# Patient Record
Sex: Female | Born: 1955
Health system: Southern US, Community
[De-identification: ages and names within clinical notes are randomized; demographics above are authoritative.]

## PROBLEM LIST (undated history)

## (undated) DIAGNOSIS — E7800 Pure hypercholesterolemia, unspecified: Secondary | ICD-10-CM

## (undated) DIAGNOSIS — L97929 Non-pressure chronic ulcer of unspecified part of left lower leg with unspecified severity: Secondary | ICD-10-CM

## (undated) DIAGNOSIS — K219 Gastro-esophageal reflux disease without esophagitis: Secondary | ICD-10-CM

## (undated) DIAGNOSIS — J189 Pneumonia, unspecified organism: Secondary | ICD-10-CM

## (undated) DIAGNOSIS — M869 Osteomyelitis, unspecified: Secondary | ICD-10-CM

## (undated) DIAGNOSIS — I10 Essential (primary) hypertension: Secondary | ICD-10-CM

## (undated) DIAGNOSIS — M1990 Unspecified osteoarthritis, unspecified site: Secondary | ICD-10-CM

## (undated) DIAGNOSIS — R7303 Prediabetes: Secondary | ICD-10-CM

## (undated) DIAGNOSIS — G629 Polyneuropathy, unspecified: Secondary | ICD-10-CM

## (undated) HISTORY — PX: BLADDER SUSPENSION: SHX72

## (undated) HISTORY — PX: ABDOMINAL HYSTERECTOMY: SHX81

---

## 2001-02-24 ENCOUNTER — Other Ambulatory Visit: Admission: RE | Admit: 2001-02-24 | Discharge: 2001-02-24 | Payer: Self-pay | Admitting: Obstetrics and Gynecology

## 2002-05-19 ENCOUNTER — Encounter: Payer: Self-pay | Admitting: Family Medicine

## 2002-05-19 ENCOUNTER — Ambulatory Visit (HOSPITAL_COMMUNITY): Admission: RE | Admit: 2002-05-19 | Discharge: 2002-05-19 | Payer: Self-pay | Admitting: Family Medicine

## 2002-06-01 ENCOUNTER — Encounter: Payer: Self-pay | Admitting: Neurology

## 2002-06-01 ENCOUNTER — Ambulatory Visit (HOSPITAL_COMMUNITY): Admission: RE | Admit: 2002-06-01 | Discharge: 2002-06-01 | Payer: Self-pay | Admitting: Neurology

## 2005-12-19 ENCOUNTER — Ambulatory Visit (HOSPITAL_COMMUNITY): Admission: RE | Admit: 2005-12-19 | Discharge: 2005-12-19 | Payer: Self-pay | Admitting: Obstetrics and Gynecology

## 2006-05-27 ENCOUNTER — Inpatient Hospital Stay (HOSPITAL_COMMUNITY): Admission: RE | Admit: 2006-05-27 | Discharge: 2006-05-29 | Payer: Self-pay | Admitting: Obstetrics and Gynecology

## 2006-05-27 ENCOUNTER — Encounter (INDEPENDENT_AMBULATORY_CARE_PROVIDER_SITE_OTHER): Payer: Self-pay | Admitting: *Deleted

## 2009-09-07 ENCOUNTER — Encounter: Payer: Self-pay | Admitting: Internal Medicine

## 2009-09-26 ENCOUNTER — Ambulatory Visit: Payer: Self-pay | Admitting: Internal Medicine

## 2009-09-26 ENCOUNTER — Ambulatory Visit (HOSPITAL_COMMUNITY): Admission: RE | Admit: 2009-09-26 | Discharge: 2009-09-26 | Payer: Self-pay | Admitting: Internal Medicine

## 2009-09-28 ENCOUNTER — Encounter: Payer: Self-pay | Admitting: Internal Medicine

## 2009-09-29 ENCOUNTER — Ambulatory Visit (HOSPITAL_COMMUNITY): Admission: RE | Admit: 2009-09-29 | Discharge: 2009-09-29 | Payer: Self-pay | Admitting: Obstetrics and Gynecology

## 2009-10-05 ENCOUNTER — Telehealth (INDEPENDENT_AMBULATORY_CARE_PROVIDER_SITE_OTHER): Payer: Self-pay

## 2009-10-10 ENCOUNTER — Encounter: Payer: Self-pay | Admitting: Internal Medicine

## 2010-05-23 NOTE — Letter (Signed)
Summary: Patient Notice, Colon Biopsy Results  [facility]  [address]   [place], [place] [address]   Phone: [phone]  Fax: [phone]       September 28, 2009   MEGHNA HAGMANN [address] [place], [place]  [address] Jun 11, 1955    Dear Ms. Schweiger,  I am pleased to inform you that the biopsies taken during your recent colonoscopy did not show any evidence of cancer upon pathologic examination.  Additional information/recommendations:  You should have a repeat colonoscopy examination  in 3 years.  Please call us if you are having persistent problems or have questions about your condition that have not been fully answered at this time.  Sincerely,    R. Roetta Sessions MD, FACP [facility] Ph: [phone]    Fax: [phone]   Appended Document: Patient Notice, Colon Biopsy Results letter mailed to pt  Appended Document: Patient Notice, Colon Biopsy Results reminder in computer

## 2010-05-23 NOTE — Progress Notes (Signed)
Summary: NON-URGENT PHONE NOTE  Phone Note Call from Patient   Caller: Patient Summary of Call: NON-URGENT CALL/ PT AWARE DR Jena Gauss IS ON VACATION.....  JUST WHEN HE RETURNS AT HIS CONVENIENCE... Pt called and said she received letter to repeat TCS in 3 years. She just wanted to know why 3, she felt it would probably be in 5 years. She also wanted to know if he would recommend Metamucil, she sees that advertised for colon. Please advise. Initial call taken by: Cloria Spring LPN,  October 05, 2009 10:37 AM     Appended Document: NON-URGENT PHONE NOTE multiple polyps and one greater than 1 cm in diameter.  Metamucil would be one way to increase fiber in diet  Appended Document: NON-URGENT PHONE NOTE tried to call pt- number has been disconnected

## 2010-05-23 NOTE — Letter (Signed)
Summary: TCS order  TCS order   Imported By: Minna Merritts 09/07/2009 14:19:07  _____________________________________________________________________  External Attachment:    Type:   Image     Comment:   External Document

## 2010-09-08 NOTE — H&P (Signed)
[name], [name]                ACCOUNT NO.:  [id number]   MEDICAL RECORD NO.:  [id number]          PATIENT TYPE:  INP   LOCATION:  NA                            FACILITY:  [facility]   PHYSICIAN:  Tilda Burrow, M.D. DATE OF BIRTH:  Mar 26, 1956   DATE OF ADMISSION:  05/27/2006  DATE OF DISCHARGE:  LH                              HISTORY & PHYSICAL   ADMISSION DIAGNOSES:  1. Cystocele.  2. Second-degree uterine descensus.  3. No evidence of urinary incontinence.  4. Lax posterior perineal body.   HISTORY OF PRESENT ILLNESS:  This 55 year old female, postmenopausal x2  years, is admitted for vaginal hysterectomy, anterior and posterior  repair.  She has cystocele, second-degree uterine descensus.  Plans are  for removal of the uterus, tubes and ovaries if accessible, anterior  repair beneath the bulging cystocele which is present to the introitus.  The posterior perineum will be tightened as well to lend support to the  anterior wall.  She does not have symptomatic rectocele complaints,  difficulty with defecation.  She specifically denies stress  incontinence.  She has been followed in our office, has had normal Pap  smears.  Two years ago, she had some postmenopausal bleeding and had  endometrial stripe confirming absence of endometrial tissues.  Pros and  cons of surgery have been reviewed including Crane's hysterectomy  booklet medical explainer, view of cystocele and uterine prolapse.  She  understands the procedure, risks, and benefits.  Specifically reviewed  plans to proceed with surgery at this time.  The tubes and ovaries will  be removed if accessible.   PAST MEDICAL HISTORY:  1. Chronic hypertension managed by [facility], Dr.      Phillips Odor.  2. Obesity.  3. Chronic neuropathy evaluated by Dr. Gerilyn Pilgrim in the past.   PAST SURGICAL HISTORY:  Vaginal delivery x2.  No other surgeries  reported.   MEDICATIONS:  Hydrochlorothiazide, gabapentin, folic  acid, B12,  potassium 20 mEq daily, and Centrum Silver multivitamins.  She has had a  bowel prep for the last 2 days.   ALLERGIES:  No known drug allergies.   SOCIAL HISTORY:  She smoked for 20 years at 1-1/2 packs per day.  She  denies alcohol and recreational drugs.   PHYSICAL EXAMINATION:  VITAL SIGNS: Height 5 feet 6 inches, weight 192.  Blood pressure 130/84.  HEENT:  Pupils equal, round, and reactive.  NECK: Supple.  CHEST:  Clear to auscultation.  ABDOMEN:  Moderate obesity.  PELVIC:  External genitalia lax posterior perineal body, second-degree  uterine descensus, cystocele present __pap________class I.  EXTREMITIES:  Without cyanosis, clubbing, or edema.   PLAN:  Total vaginal hysterectomy anterior and posterior repair with  removal of tubes and ovaries May 27, 2006.      Tilda Burrow, M.D.  Electronically Signed     JVF/MEDQ  D:  05/26/2006  T:  05/26/2006  Job:  [id number]   cc:   [facility]  Fax: [phone]

## 2010-09-08 NOTE — Op Note (Signed)
NAMEBARBARA, AHART                ACCOUNT NO.:  [id number]   MEDICAL RECORD NO.:  [id number]          PATIENT TYPE:  INP   LOCATION:  A428                          FACILITY:  [facility]   PHYSICIAN:  Tilda Burrow, M.D. DATE OF BIRTH:  [birth year]   DATE OF PROCEDURE:  05/27/2006  DATE OF DISCHARGE:                               OPERATIVE REPORT   PREOPERATIVE DIAGNOSIS:  Cystocele, second degree uterine descensus, lax  perineal body.  Multiple skin nevi, neck and face.   POSTOPERATIVE DIAGNOSIS:  Cystocele, second degree uterine descensus,  relaxed perineal body secondary to scarring from old obstetric  laceration.   OPERATION PERFORMED:  Vaginal hysterectomy, anterior and posterior  repair, bilateral salpingo-oophorectomy, excision of multiple skin nevi,  neck and face (30).   SURGEON:  Tilda Burrow, M.D.   ASSISTANTMarlinda Mike, RN.  Blackwell, CST.   ANESTHESIA:  General.   COMPLICATIONS:  None.   FINDINGS:  Extensive scarring in the old episiotomy area suggesting  secondary intention healing of the old episiotomy from her son's birth,  many years ago.   DESCRIPTION OF PROCEDURE:  The patient was taken to the operating room,  prepped and draped for combined abdominal and vaginal procedure.  Prior  to this, we addressed the issue of the multiple skin tags on the base of  the neck and face.  These were elevated with Addison scissors, cross-  clamped with a hemostat and trimmed using iris scissors, with multiple  small skin tags removed from the base of the neck, from the left mid  neck and clavicle area.  There was one fairly significant one on the  middle of her left cheek which was excised at its base after cross  clamping as well.  All these responded with adequate hemostasis and  topical neosporin was applied.  Hysterectomy was then addressed by  prepping and draping the perineum, placing a three inch weighted  speculum into the vagina, posterior colpotomy incision as  well was  making anterior colpotomy incision.  The cervix was circumscribed.  The  uterosacral ligaments were clamped, cut and suture ligated bilaterally,  using curved Zeppelin clamps, Mayo scissor transection and 0 chromic  suture ligature which was tagged.  The lower cardinal ligaments were  clamped, cut and suture ligated bilaterally.  The upper cardinal  ligaments were similarly clamped, cut and ligated using 0 chromic.  We  then marched up the broad ligament, clamping, cutting and suture  ligating it until reaching the level of the round ligaments.  The round  ligament, utero-ovarian ligament and fallopian tube were cross-clamped  in one single pedicle.  The uterus was removed.  The hemostasis was  satisfactory.  At this point we isolated the tube and ovary on the left  side, placing a Babcock clamp, retracting it inferiorly, cross-clamping  the infundibulopelvic ligament with a curved Zeppelin clamp, ligating it  and excising the specimen.  Hemostasis was checked and was satisfactory.  The opposite side was treated similarly with a double ligature on this  pedicle.   We then proceeded with inspection of the pedicles.  We found adequate  hemostasis.  0 Prolene suture was used.  2-0 Prolene suture was then  used to attach a portion of a pouch of Douglas, the cul-de-sac, to the  uterosacral ligament on each side, thus in effect, reducing the  potential for vault prolapse later.  The anterior peritoneum could be  pulled posteriorly and attached above the cuff.  The uterosacral  ligaments were pulled into the midline and like tied together.   The anterior portion of the cuff was then addressed.  Anterior repair  then was performed by grasping the anterior vaginal mucosa with two  Allis clamps, a third Allis clamp placed at the junction of the urethra  and bladder as judged by the bulge of the cystocele.  The mucosa was  trimmed off the underlying large cystocele and pulled laterally.   A  series of horizontal mattress sutures x 3 were placed to reduce the  cystocele bulge and then the redundant vaginal mucosa trimmed.  The  anterior skin edges were closed from along the entirety including the  vaginal cuff.   At this point we proceeded with posterior repair.  Palpation showed that  there was a large defect just inside the introitus and the introitus was  extremely lax.  The digital rectal exam showed adequate rectal support  but there was fibrosis all the way down to just above the rectal mucosa.  The posterior fourchette was opened transversely and an ellipse of skin  removed and then the posterior vaginal mucosa elevated over the bottom  one third of the vaginal length.  This included coring out the old  fibrotic scarred area where the previous episiotomy had healed poorly.  This has allowed [place] to identify good strong supportive tissue on each  side and a new series of vertical mattress sutures were placed using 0  Dexon pulling the perineal body together.  The first layer had three  interrupted sutures which resulted in dramatic improvement in the  perineal support, then the second and third layers with continuous  running 0 Dexon with good rebuilding of the perineum.  The vaginal  mucosa was trimmed very slightly and then continuous running 2-0 chromic  used to close the vaginal mucosa.  The perineal body was dramatically  improved in its firmness and support of the anterior vagina.  Sponge and  needle counts were correct.  Vaginal pack was inserted as well as Foley  catheter with the patient tolerating procedure well, going to recovery  room in good condition.      Tilda Burrow, M.D.  Electronically Signed     JVF/MEDQ  D:  05/27/2006  T:  05/27/2006  Job:  [id number]

## 2010-09-08 NOTE — Discharge Summary (Signed)
NAMEDULA, HAVLIK                ACCOUNT NO.:  [id number]   MEDICAL RECORD NO.:  [id number]          PATIENT TYPE:  INP   LOCATION:  A428                          FACILITY:  [facility]   PHYSICIAN:  Tilda Burrow, M.D. DATE OF BIRTH:  1955-07-04   DATE OF ADMISSION:  05/27/2006  DATE OF DISCHARGE:  02/06/2008LH                               DISCHARGE SUMMARY   ADMISSION DIAGNOSES:  1. Cystocele.  2. Secondary uterine descensus.  3. Relaxed perineal body.  4. Multiple skin nevi neck and face.   DISCHARGE DIAGNOSES:  1. Cystocele.  2. Secondary uterine descensus.  3. Relaxed perineal body.  4. Multiple skin nevi neck and face.  5. Postoperative urinary retention.   PROCEDURES:  1. Vaginal hysterectomy with bilateral salpingo-oophorectomy.  2. Anterior and posterior repair.  3. Excision of multiple skin nevi neck and face.   HOSPITAL SUMMARY:  A 55 year old female, postmenopausal  for years,  admitted for TVH, A&P repair with BSO planned.  She also had multiple  skin nevi removed off the neck.   PAST MEDICAL HISTORY:  1. Chronic hypertension.  2. Obesity.  3. Chronic peripheral neuropathy.   HOSPITAL COURSE:  The patient was admitted.  Laboratory data on  admission includes hemoglobin 14, hematocrit 43.7.  BUN 3, creatinine  0.62.  Albumin 3.5.  Liver function tests normal.  Blood type is A  positive.  She underwent a hysterectomy with BSO, A&P repair with 300 mL  blood loss.   Postoperatively, the patient had straight forward course and vaginal  packing overnight.  Had no major vaginal bleeding.  Hemoglobin 12.7,  hematocrit 37.2 postoperative day #1.  She had hypoactive bowel sounds  that day.  She had catheter removed and had urinary retention, was  unable to void and had it reinserted.  She was kept overnight, one more  night, had the catheter removed once again and was able to void,  reinsertion of the catheter revealed 600 mL of clear urine.  We left  Foley bag in.   Will discharge her home on the following medications.   PREVIOUS MEDICINES:  1. Potassium chloride 20 mEq daily.  2. Hydrochlorothiazide 25 mg p.o. daily.  3. Gabapentin 300 mg at bedtime.  4. Folic acid daily.  5. B12 daily.  6. Centrum multivitamins daily.   ADDITIONAL MEDICATIONS:  1. Tylox one q.4h. p.r.n. pain.  2. Laxative of choice daily.  3. Levaquin 500 mg p.o. daily x7 days.   FOLLOW UP:  Five days in our office with Foley catheter to be removed  that morning and with recheck in our office on Monday.      Tilda Burrow, M.D.  Electronically Signed     JVF/MEDQ  D:  05/29/2006  T:  05/29/2006  Job:  [id number]   cc:   [facility]

## 2011-12-10 ENCOUNTER — Other Ambulatory Visit (HOSPITAL_COMMUNITY): Payer: Self-pay | Admitting: Family Medicine

## 2011-12-10 ENCOUNTER — Ambulatory Visit (HOSPITAL_COMMUNITY)
Admission: RE | Admit: 2011-12-10 | Discharge: 2011-12-10 | Disposition: A | Discharge status: Home / Self Care | Payer: BC Managed Care – PPO | Source: Ambulatory Visit | Attending: Family Medicine | Admitting: Family Medicine

## 2011-12-10 DIAGNOSIS — M25579 Pain in unspecified ankle and joints of unspecified foot: Secondary | ICD-10-CM | POA: Insufficient documentation

## 2011-12-10 DIAGNOSIS — M25473 Effusion, unspecified ankle: Secondary | ICD-10-CM | POA: Insufficient documentation

## 2011-12-10 DIAGNOSIS — S93409A Sprain of unspecified ligament of unspecified ankle, initial encounter: Secondary | ICD-10-CM

## 2011-12-10 DIAGNOSIS — S82899A Other fracture of unspecified lower leg, initial encounter for closed fracture: Secondary | ICD-10-CM | POA: Insufficient documentation

## 2011-12-10 DIAGNOSIS — X58XXXA Exposure to other specified factors, initial encounter: Secondary | ICD-10-CM | POA: Insufficient documentation

## 2011-12-10 DIAGNOSIS — M25476 Effusion, unspecified foot: Secondary | ICD-10-CM | POA: Insufficient documentation

## 2012-02-20 ENCOUNTER — Other Ambulatory Visit: Payer: Self-pay | Admitting: Obstetrics and Gynecology

## 2012-02-20 DIAGNOSIS — Z139 Encounter for screening, unspecified: Secondary | ICD-10-CM

## 2012-03-04 ENCOUNTER — Ambulatory Visit (HOSPITAL_COMMUNITY)
Admission: RE | Admit: 2012-03-04 | Discharge: 2012-03-04 | Disposition: A | Discharge status: Home / Self Care | Payer: BC Managed Care – PPO | Source: Ambulatory Visit | Attending: Obstetrics and Gynecology | Admitting: Obstetrics and Gynecology

## 2012-03-04 DIAGNOSIS — Z1231 Encounter for screening mammogram for malignant neoplasm of breast: Secondary | ICD-10-CM | POA: Insufficient documentation

## 2012-03-04 DIAGNOSIS — Z139 Encounter for screening, unspecified: Secondary | ICD-10-CM

## 2012-07-30 ENCOUNTER — Other Ambulatory Visit (HOSPITAL_COMMUNITY): Payer: Self-pay | Admitting: Orthopaedic Surgery

## 2012-07-30 DIAGNOSIS — M25562 Pain in left knee: Secondary | ICD-10-CM

## 2012-07-31 ENCOUNTER — Ambulatory Visit (HOSPITAL_COMMUNITY)
Admission: RE | Admit: 2012-07-31 | Discharge: 2012-07-31 | Disposition: A | Discharge status: Home / Self Care | Payer: BC Managed Care – PPO | Source: Ambulatory Visit | Attending: Orthopaedic Surgery | Admitting: Orthopaedic Surgery

## 2012-07-31 DIAGNOSIS — M25562 Pain in left knee: Secondary | ICD-10-CM

## 2012-07-31 DIAGNOSIS — R937 Abnormal findings on diagnostic imaging of other parts of musculoskeletal system: Secondary | ICD-10-CM | POA: Insufficient documentation

## 2012-07-31 DIAGNOSIS — M7120 Synovial cyst of popliteal space [Baker], unspecified knee: Secondary | ICD-10-CM | POA: Insufficient documentation

## 2012-07-31 DIAGNOSIS — W19XXXA Unspecified fall, initial encounter: Secondary | ICD-10-CM | POA: Insufficient documentation

## 2012-07-31 DIAGNOSIS — IMO0002 Reserved for concepts with insufficient information to code with codable children: Secondary | ICD-10-CM | POA: Insufficient documentation

## 2012-07-31 DIAGNOSIS — M25569 Pain in unspecified knee: Secondary | ICD-10-CM | POA: Insufficient documentation

## 2012-08-04 ENCOUNTER — Other Ambulatory Visit (HOSPITAL_COMMUNITY): Payer: BC Managed Care – PPO

## 2012-10-07 ENCOUNTER — Encounter: Payer: Self-pay | Admitting: Internal Medicine

## 2014-02-04 ENCOUNTER — Ambulatory Visit (INDEPENDENT_AMBULATORY_CARE_PROVIDER_SITE_OTHER): Payer: BC Managed Care – PPO | Admitting: Otolaryngology

## 2014-02-04 DIAGNOSIS — H9209 Otalgia, unspecified ear: Secondary | ICD-10-CM

## 2014-02-04 DIAGNOSIS — T162XXA Foreign body in left ear, initial encounter: Secondary | ICD-10-CM

## 2014-02-04 DIAGNOSIS — H903 Sensorineural hearing loss, bilateral: Secondary | ICD-10-CM

## 2014-02-16 ENCOUNTER — Ambulatory Visit (HOSPITAL_COMMUNITY)
Admission: RE | Admit: 2014-02-16 | Discharge: 2014-02-16 | Disposition: A | Discharge status: Home / Self Care | Payer: BC Managed Care – PPO | Source: Ambulatory Visit | Attending: Family Medicine | Admitting: Family Medicine

## 2014-02-16 ENCOUNTER — Other Ambulatory Visit (HOSPITAL_COMMUNITY): Payer: Self-pay | Admitting: Family Medicine

## 2014-02-16 DIAGNOSIS — M79644 Pain in right finger(s): Secondary | ICD-10-CM | POA: Diagnosis present

## 2014-02-16 DIAGNOSIS — M79641 Pain in right hand: Secondary | ICD-10-CM

## 2014-02-16 DIAGNOSIS — M19041 Primary osteoarthritis, right hand: Secondary | ICD-10-CM | POA: Insufficient documentation

## 2015-01-10 ENCOUNTER — Other Ambulatory Visit (HOSPITAL_COMMUNITY): Payer: Self-pay | Admitting: Family Medicine

## 2015-01-10 DIAGNOSIS — Z0000 Encounter for general adult medical examination without abnormal findings: Secondary | ICD-10-CM

## 2015-01-13 ENCOUNTER — Ambulatory Visit (HOSPITAL_COMMUNITY)
Admission: RE | Admit: 2015-01-13 | Discharge: 2015-01-13 | Disposition: A | Discharge status: Home / Self Care | Payer: BC Managed Care – PPO | Source: Ambulatory Visit | Attending: Family Medicine | Admitting: Family Medicine

## 2015-01-13 DIAGNOSIS — Z1231 Encounter for screening mammogram for malignant neoplasm of breast: Secondary | ICD-10-CM | POA: Diagnosis not present

## 2015-01-13 DIAGNOSIS — Z0000 Encounter for general adult medical examination without abnormal findings: Secondary | ICD-10-CM

## 2015-11-22 ENCOUNTER — Ambulatory Visit (HOSPITAL_COMMUNITY)
Admission: RE | Admit: 2015-11-22 | Discharge: 2015-11-22 | Disposition: A | Discharge status: Home / Self Care | Payer: BC Managed Care – PPO | Source: Ambulatory Visit | Attending: Family Medicine | Admitting: Family Medicine

## 2015-11-22 ENCOUNTER — Other Ambulatory Visit (HOSPITAL_COMMUNITY): Payer: Self-pay | Admitting: Family Medicine

## 2015-11-22 DIAGNOSIS — S93402A Sprain of unspecified ligament of left ankle, initial encounter: Secondary | ICD-10-CM | POA: Diagnosis present

## 2015-11-22 DIAGNOSIS — X58XXXD Exposure to other specified factors, subsequent encounter: Secondary | ICD-10-CM | POA: Diagnosis not present

## 2015-11-22 DIAGNOSIS — M19072 Primary osteoarthritis, left ankle and foot: Secondary | ICD-10-CM | POA: Insufficient documentation

## 2016-07-26 ENCOUNTER — Other Ambulatory Visit (HOSPITAL_COMMUNITY): Payer: Self-pay | Admitting: Family Medicine

## 2016-07-26 DIAGNOSIS — Z1231 Encounter for screening mammogram for malignant neoplasm of breast: Secondary | ICD-10-CM

## 2016-07-30 ENCOUNTER — Ambulatory Visit (HOSPITAL_COMMUNITY)
Admission: RE | Admit: 2016-07-30 | Discharge: 2016-07-30 | Disposition: A | Discharge status: Home / Self Care | Payer: BC Managed Care – PPO | Source: Ambulatory Visit | Attending: Family Medicine | Admitting: Family Medicine

## 2016-07-30 DIAGNOSIS — Z1231 Encounter for screening mammogram for malignant neoplasm of breast: Secondary | ICD-10-CM | POA: Insufficient documentation

## 2017-10-17 ENCOUNTER — Ambulatory Visit (HOSPITAL_COMMUNITY)
Admission: RE | Admit: 2017-10-17 | Discharge: 2017-10-17 | Disposition: A | Discharge status: Home / Self Care | Payer: BC Managed Care – PPO | Source: Ambulatory Visit | Attending: Family Medicine | Admitting: Family Medicine

## 2017-10-17 ENCOUNTER — Other Ambulatory Visit (HOSPITAL_COMMUNITY): Payer: Self-pay | Admitting: Family Medicine

## 2017-10-17 DIAGNOSIS — R918 Other nonspecific abnormal finding of lung field: Secondary | ICD-10-CM | POA: Insufficient documentation

## 2017-10-17 DIAGNOSIS — I517 Cardiomegaly: Secondary | ICD-10-CM | POA: Diagnosis not present

## 2017-10-17 DIAGNOSIS — J811 Chronic pulmonary edema: Secondary | ICD-10-CM | POA: Insufficient documentation

## 2018-02-14 ENCOUNTER — Encounter: Payer: Self-pay | Admitting: *Deleted

## 2018-02-14 ENCOUNTER — Telehealth: Payer: Self-pay | Admitting: *Deleted

## 2018-02-14 ENCOUNTER — Ambulatory Visit: Payer: BC Managed Care – PPO | Admitting: Internal Medicine

## 2018-02-14 ENCOUNTER — Other Ambulatory Visit: Payer: Self-pay | Admitting: *Deleted

## 2018-02-14 ENCOUNTER — Encounter: Payer: Self-pay | Admitting: Internal Medicine

## 2018-02-14 VITALS — BP 177/85 | HR 73 | Temp 97.1°F | Ht 65.5 in | Wt 199.6 lb

## 2018-02-14 DIAGNOSIS — R194 Change in bowel habit: Secondary | ICD-10-CM | POA: Diagnosis not present

## 2018-02-14 DIAGNOSIS — Z86010 Personal history of colonic polyps: Secondary | ICD-10-CM

## 2018-02-14 MED ORDER — PEG 3350-KCL-NA BICARB-NACL 420 G PO SOLR: 4000.0000 mL | Freq: Once | ORAL | 0 refills | Status: AC

## 2018-02-14 NOTE — Progress Notes (Signed)
Primary Care Physician:  Assunta Found, MD Primary Gastroenterologist:  Dr. Jena Gauss  Pre-Procedure History & Physical: HPI:  [name] is a 62 y.o. female here for follow-up.  Notes insidious difficulties eating bacon and eggs in the morning.  Has abdominal discomfort and loose nonbloody stools after consuming these foods.  Does well the rest of the day.  Does well with most everything else although can occasionally have symptoms with other foods.  Has  1-2 bowel movements daily in general  -  no bleeding.  Denies weight loss.  No upper GI tract symptoms such as odynophagia, dysphagia, early satiety nausea or vomiting.  Denies weight loss.  Reportedly borderline diabetic.  Has a history of a lower extremity neuropathy for which takes Neurontin.  She is on B12 and folate supplementation for presumed deficiency. No history consistent with artificial sweetener intake or lactose intolerance.  GI history most significant in that she had multiple colonic adenomas removed at colonoscopy 2011 and failed to return for surveillance exam 2014.  No family history of inflammatory bowel disease or colon cancer.  Denies GI surgeries otherwise.  Gallbladder remains in situ.  GYN history significant for BSO TAH with AP repair.  Has been on stool softeners since that surgery in 2008.  Denies constipation.  Denies straining.   Prior to Admission medications   Medication Sig Start Date End Date Taking? Authorizing Provider  atorvastatin (LIPITOR) 10 MG tablet Take 1.5 tablets by mouth daily. 01/31/18  Yes [provider]  Calcium Carb-Cholecalciferol (CALCIUM + D3 PO) Take by mouth daily.   Yes [provider]  Calcium Citrate-Vitamin D (CITRACAL PETITES/VITAMIN D PO) Take by mouth daily.   Yes [provider]  cetirizine (ZYRTEC) 10 MG tablet Take 10 mg by mouth as needed for allergies.   Yes [provider]  docusate sodium (COLACE) 100 MG capsule Take 200 mg by mouth  daily.   Yes [provider]  folic acid (FOLVITE) 800 MCG tablet Take 400 mcg by mouth daily.   Yes [provider]  gabapentin (NEURONTIN) 300 MG capsule Take 300 mg by mouth 2 (two) times daily. 01/17/18  Yes [provider]  hydrochlorothiazide (HYDRODIURIL) 25 MG tablet Take 25 mg by mouth daily. 01/17/18  Yes [provider]  Multiple Vitamin (MULTIVITAMIN) tablet Take 1 tablet by mouth daily.   Yes [provider]  Omega-3 Fatty Acids (FISH OIL) 1200 MG CAPS Take by mouth daily.   Yes [provider]  potassium chloride SA (K-DUR,KLOR-CON) 20 MEQ tablet Take 20 mEq by mouth daily. 02/06/18  Yes [provider]  vitamin B-12 (CYANOCOBALAMIN) 1000 MCG tablet Take 1,000 mcg by mouth daily.   Yes [provider]    Allergies as of 02/14/2018  . (No Known Allergies)    No family history on file.  Social History   Socioeconomic History  . Marital status: Widowed    Spouse name: Not on file  . Number of children: Not on file  . Years of education: Not on file  . Highest education level: Not on file  Occupational History  . Not on file  Social Needs  . Financial resource strain: Not on file  . Food insecurity:    Worry: Not on file    Inability: Not on file  . Transportation needs:    Medical: Not on file    Non-medical: Not on file  Tobacco Use  . Smoking status: Former Games developer  . Smokeless  tobacco: Never Used  Substance and Sexual Activity  . Alcohol use: Not Currently    Frequency: Never  . Drug use: Never  . Sexual activity: Not on file  Lifestyle  . Physical activity:    Days per week: Not on file    Minutes per session: Not on file  . Stress: Not on file  Relationships  . Social connections:    Talks on phone: Not on file    Gets together: Not on file    Attends religious service: Not on file    Active member of club or organization: Not on file    Attends meetings of clubs or organizations:  Not on file    Relationship status: Not on file  . Intimate partner violence:    Fear of current or ex partner: Not on file    Emotionally abused: Not on file    Physically abused: Not on file    Forced sexual activity: Not on file  Other Topics Concern  . Not on file  Social History Narrative  . Not on file    Review of Systems: See HPI, otherwise negative ROS  Physical Exam: BP (!) 177/85   Pulse 73   Temp (!) 97.1 F (36.2 C) (Oral)   Ht 5' 5.5" (1.664 m)   Wt 199 lb 9.6 oz (90.5 kg)   BMI 32.71 kg/m  General:   Alert,  Well-developed, well-nourished, pleasant and cooperative in NAD Neck:  Supple; no masses or thyromegaly. No significant cervical adenopathy. Lungs:  Clear throughout to auscultation.   No wheezes, crackles, or rhonchi. No acute distress. Heart:  Regular rate and rhythm; no murmurs, clicks, rubs,  or gallops. Abdomen: Non-distended, normal bowel sounds.  Soft and nontender without appreciable mass or hepatosplenomegaly.  Pulses:  Normal pulses noted. Extremities:  Without clubbing or edema.  Rectal deferred until colonoscopy  Impression/Plan: Pleasant 62 year old lady with a peculiar change in bowel habits related to consumption of eggs and bacon and sometimes other foods from time to time.  Does not sound like an allergic reaction.  Does well a good bit of the time. Her bowel symptoms are quite intermittent.  She does not have incessant diarrhea.  No nocturnal component.  I doubt we are dealing with an infectious/inflammatory process.  History of neuropathy which may well be due to elevated blood sugars from time to time.  It is interesting that she is on a folate and B12 regimen for presumed deficiency.    I do not see any medications to implicate.  May be simply nothing more than irritable bowel syndrome.  She needs screening for celiac disease.  Pancreatic exocrine insufficiency remains in the differential.  I would like to see recent labs done to Dr.  Lamar Blinks office recently.  History of multiple colonic adenomas removed previously; significantly overdue for follow-up examination.  Recommendations:  This lady needs an ileocolonoscopy in the near future as outlined.  Consider taking segmental biopsies just to make sure there is not an element of microscopic colitis in the mix here.  She will need a celiac screen but again would like to see labs done recently through Dr. Lamar Blinks office.  Have also asked this nice lady to simply refrain from taking her stool softener between now the time of her colonoscopy to observe for any improvement in bowel function.  If she has difficulties doing this in the interim she is to let me know  The risks, benefits, limitations, alternatives and imponderables have been  reviewed with the patient. Questions have been answered. All parties are agreeable.   Further recommendations to follow.         Notice: This dictation was prepared with Dragon dictation along with smaller phrase technology. Any transcriptional errors that result from this process are unintentional and may not be corrected upon review.

## 2018-02-14 NOTE — Telephone Encounter (Signed)
Received VM from patient needing to change TCS date scheduled for 03/10/18.  Called patient and she is now scheduled for 03/13/18 at 1:30pm. I have mailed new instructions to her. Called Endo and spoke with Selena Batten, she is aware of the change. Pre-op can stay on 03/05/18.

## 2018-02-14 NOTE — Patient Instructions (Signed)
Will retrieve recent labs from Dr. Phillips Odor  Will need a serum total IGA and TTG IGA to check for celiac disease and a TSH to check thyroid  Stop stool softener for now  Schedule a Colonoscopy (hx of colon polyps and change in bowel habits) Propofol  Further recommendations to follow

## 2018-02-14 NOTE — Telephone Encounter (Signed)
Pre-op scheduled for 03/05/18 at 2:15pm. Letter mailed. Patient aware.

## 2018-02-27 NOTE — Patient Instructions (Signed)
[name]  02/27/2018     @PREFPERIOPPHARMACY @   Your procedure is scheduled on  03/13/2018 .  Report to [facility] at  1200   P.M.  Call this number if you have problems the morning of surgery:  [phone]   Remember:  Follow the diet and prep instructions given to you by Dr Luvenia Starch  Office.                       Take these medicines the morning of surgery with A SIP OF WATER  Zyrtec, gabapentin.    Do not wear jewelry, make-up or nail polish.  Do not wear lotions, powders, or perfumes, or deodorant.  Do not shave 48 hours prior to surgery.  Men may shave face and neck.  Do not bring valuables to the hospital.  [facility] is not responsible for any belongings or valuables.  Contacts, dentures or bridgework may not be worn into surgery.  Leave your suitcase in the car.  After surgery it may be brought to your room.  For patients admitted to the hospital, discharge time will be determined by your treatment team.  Patients discharged the day of surgery will not be allowed to drive home.   Name and phone number of your driver:   family Special instructions:  None  Please read over the following fact sheets that you were given. Anesthesia Post-op Instructions and Care and Recovery After Surgery       Colonoscopy, Adult A colonoscopy is an exam to look at the large intestine. It is done to check for problems, such as:  Lumps (tumors).  Growths (polyps).  Swelling (inflammation).  Bleeding.  What happens before the procedure? Eating and drinking Follow instructions from your doctor about eating and drinking. These instructions may include:  A few days before the procedure - follow a low-fiber diet. ? Avoid nuts. ? Avoid seeds. ? Avoid dried fruit. ? Avoid raw fruits. ? Avoid vegetables.  1-3 days before the procedure - follow a clear liquid diet. Avoid liquids that have red or purple dye. Drink only clear liquids, such as: ? Clear broth  or bouillon. ? Black coffee or tea. ? Clear juice. ? Clear soft drinks or sports drinks. ? Gelatin dessert. ? Popsicles.  On the day of the procedure - do not eat or drink anything during the 2 hours before the procedure.  Bowel prep If you were prescribed an oral bowel prep:  Take it as told by your doctor. Starting the day before your procedure, you will need to drink a lot of liquid. The liquid will cause you to poop (have bowel movements) until your poop is almost clear or light green.  If your skin or butt gets irritated from diarrhea, you may: ? Wipe the area with wipes that have medicine in them, such as adult wet wipes with aloe and vitamin E. ? Put something on your skin that soothes the area, such as petroleum jelly.  If you throw up (vomit) while drinking the bowel prep, take a break for up to 60 minutes. Then begin the bowel prep again. If you keep throwing up and you cannot take the bowel prep without throwing up, call your doctor.  General instructions  Ask your doctor about changing or stopping your normal medicines. This is important if you take diabetes medicines or blood thinners.  Plan to have someone take you home  from the hospital or clinic. What happens during the procedure?  An IV tube may be put into one of your veins.  You will be given medicine to help you relax (sedative).  To reduce your risk of infection: ? Your doctors will wash their hands. ? Your anal area will be washed with soap.  You will be asked to lie on your side with your knees bent.  Your doctor will get a long, thin, flexible tube ready. The tube will have a camera and a light on the end.  The tube will be put into your anus.  The tube will be gently put into your large intestine.  Air will be delivered into your large intestine to keep it open. You may feel some pressure or cramping.  The camera will be used to take photos.  A small tissue sample may be removed from your body  to be looked at under a microscope (biopsy). If any possible problems are found, the tissue will be sent to a lab for testing.  If small growths are found, your doctor may remove them and have them checked for cancer.  The tube that was put into your anus will be slowly removed. The procedure may vary among doctors and hospitals. What happens after the procedure?  Your doctor will check on you often until the medicines you were given have worn off.  Do not drive for 24 hours after the procedure.  You may have a small amount of blood in your poop.  You may pass gas.  You may have mild cramps or bloating in your belly (abdomen).  It is up to you to get the results of your procedure. Ask your doctor, or the department performing the procedure, when your results will be ready. This information is not intended to replace advice given to you by your health care provider. Make sure you discuss any questions you have with your health care provider. Document Released: 05/12/2010 Document Revised: 02/08/2016 Document Reviewed: 06/21/2015 Elsevier Interactive Patient Education  2017 [employer]vier Inc[employer]  Colonoscopy, Adult, Care After This sheet gives you information about how to care for yourself after your procedure. Your health care provider may also give you more specific instructions. If you have problems or questions, contact your health care provider. What can I expect after the procedure? After the procedure, it is common to have:  A small amount of blood in your stool for 24 hours after the procedure.  Some gas.  Mild abdominal cramping or bloating.  Follow these instructions at home: General instructions   For the first 24 hours after the procedure: ? Do not drive or use machinery. ? Do not sign important documents. ? Do not drink alcohol. ? Do your regular daily activities at a slower pace than normal. ? Eat soft, easy-to-digest foods. ? Rest often.  Take over-the-counter or  prescription medicines only as told by your health care provider.  It is up to you to get the results of your procedure. Ask your health care provider, or the department performing the procedure, when your results will be ready. Relieving cramping and bloating  Try walking around when you have cramps or feel bloated.  Apply heat to your abdomen as told by your health care provider. Use a heat source that your health care provider recommends, such as a moist heat pack or a heating pad. ? Place a towel between your skin and the heat source. ? Leave the heat on for 20-30 minutes. ?  Remove the heat if your skin turns bright red. This is especially important if you are unable to feel pain, heat, or cold. You may have a greater risk of getting burned. Eating and drinking  Drink enough fluid to keep your urine clear or pale yellow.  Resume your normal diet as instructed by your health care provider. Avoid heavy or fried foods that are hard to digest.  Avoid drinking alcohol for as long as instructed by your health care provider. Contact a health care provider if:  You have blood in your stool 2-3 days after the procedure. Get help right away if:  You have more than a small spotting of blood in your stool.  You pass large blood clots in your stool.  Your abdomen is swollen.  You have nausea or vomiting.  You have a fever.  You have increasing abdominal pain that is not relieved with medicine. This information is not intended to replace advice given to you by your health care provider. Make sure you discuss any questions you have with your health care provider. Document Released: 11/22/2003 Document Revised: 01/02/2016 Document Reviewed: 06/21/2015 Elsevier Interactive Patient Education  2018 [place] Anesthesia is a term that refers to techniques, procedures, and medicines that help a person stay safe and comfortable during a medical procedure.  Monitored anesthesia care, or sedation, is one type of anesthesia. Your anesthesia specialist may recommend sedation if you will be having a procedure that does not require you to be unconscious, such as:  Cataract surgery.  A dental procedure.  A biopsy.  A colonoscopy.  During the procedure, you may receive a medicine to help you relax (sedative). There are three levels of sedation:  Mild sedation. At this level, you may feel awake and relaxed. You will be able to follow directions.  Moderate sedation. At this level, you will be sleepy. You may not remember the procedure.  Deep sedation. At this level, you will be asleep. You will not remember the procedure.  The more medicine you are given, the deeper your level of sedation will be. Depending on how you respond to the procedure, the anesthesia specialist may change your level of sedation or the type of anesthesia to fit your needs. An anesthesia specialist will monitor you closely during the procedure. Let your health care provider know about:  Any allergies you have.  All medicines you are taking, including vitamins, herbs, eye drops, creams, and over-the-counter medicines.  Any use of steroids (by mouth or as a cream).  Any problems you or family members have had with sedatives and anesthetic medicines.  Any blood disorders you have.  Any surgeries you have had.  Any medical conditions you have, such as sleep apnea.  Whether you are pregnant or may be pregnant.  Any use of cigarettes, alcohol, or street drugs. What are the risks? Generally, this is a safe procedure. However, problems may occur, including:  Getting too much medicine (oversedation).  Nausea.  Allergic reaction to medicines.  Trouble breathing. If this happens, a breathing tube may be used to help with breathing. It will be removed when you are awake and breathing on your own.  Heart trouble.  Lung trouble.  Before the procedure Staying  hydrated Follow instructions from your health care provider about hydration, which may include:  Up to 2 hours before the procedure - you may continue to drink clear liquids, such as water, clear fruit juice, black coffee, and plain tea.  Eating and drinking restrictions Follow instructions from your health care provider about eating and drinking, which may include:  8 hours before the procedure - stop eating heavy meals or foods such as meat, fried foods, or fatty foods.  6 hours before the procedure - stop eating light meals or foods, such as toast or cereal.  6 hours before the procedure - stop drinking milk or drinks that contain milk.  2 hours before the procedure - stop drinking clear liquids.  Medicines Ask your health care provider about:  Changing or stopping your regular medicines. This is especially important if you are taking diabetes medicines or blood thinners.  Taking medicines such as aspirin and ibuprofen. These medicines can thin your blood. Do not take these medicines before your procedure if your health care provider instructs you not to.  Tests and exams  You will have a physical exam.  You may have blood tests done to show: ? How well your kidneys and liver are working. ? How well your blood can clot.  General instructions  Plan to have someone take you home from the hospital or clinic.  If you will be going home right after the procedure, plan to have someone with you for 24 hours.  What happens during the procedure?  Your blood pressure, heart rate, breathing, level of pain and overall condition will be monitored.  An IV tube will be inserted into one of your veins.  Your anesthesia specialist will give you medicines as needed to keep you comfortable during the procedure. This may mean changing the level of sedation.  The procedure will be performed. After the procedure  Your blood pressure, heart rate, breathing rate, and blood oxygen level  will be monitored until the medicines you were given have worn off.  Do not drive for 24 hours if you received a sedative.  You may: ? Feel sleepy, clumsy, or nauseous. ? Feel forgetful about what happened after the procedure. ? Have a sore throat if you had a breathing tube during the procedure. ? Vomit. This information is not intended to replace advice given to you by your health care provider. Make sure you discuss any questions you have with your health care provider. Document Released: 01/03/2005 Document Revised: 09/16/2015 Document Reviewed: 07/31/2015 Elsevier Interactive Patient Education  2018 [place], Care After These instructions provide you with information about caring for yourself after your procedure. Your health care provider may also give you more specific instructions. Your treatment has been planned according to current medical practices, but problems sometimes occur. Call your health care provider if you have any problems or questions after your procedure. What can I expect after the procedure? After your procedure, it is common to:  Feel sleepy for several hours.  Feel clumsy and have poor balance for several hours.  Feel forgetful about what happened after the procedure.  Have poor judgment for several hours.  Feel nauseous or vomit.  Have a sore throat if you had a breathing tube during the procedure.  Follow these instructions at home: For at least 24 hours after the procedure:   Do not: ? Participate in activities in which you could fall or become injured. ? Drive. ? Use heavy machinery. ? Drink alcohol. ? Take sleeping pills or medicines that cause drowsiness. ? Make important decisions or sign legal documents. ? Take care of children on your own.  Rest. Eating and drinking  Follow the diet that is recommended by your  health care provider.  If you vomit, drink water, juice, or soup when you can drink without  vomiting.  Make sure you have little or no nausea before eating solid foods. General instructions  Have a responsible adult stay with you until you are awake and alert.  Take over-the-counter and prescription medicines only as told by your health care provider.  If you smoke, do not smoke without supervision.  Keep all follow-up visits as told by your health care provider. This is important. Contact a health care provider if:  You keep feeling nauseous or you keep vomiting.  You feel light-headed.  You develop a rash.  You have a fever. Get help right away if:  You have trouble breathing. This information is not intended to replace advice given to you by your health care provider. Make sure you discuss any questions you have with your health care provider. Document Released: 07/31/2015 Document Revised: 11/30/2015 Document Reviewed: 07/31/2015 Elsevier Interactive Patient Education  [employer].

## 2018-03-04 ENCOUNTER — Other Ambulatory Visit (HOSPITAL_COMMUNITY): Payer: Self-pay | Admitting: Family Medicine

## 2018-03-04 DIAGNOSIS — Z1231 Encounter for screening mammogram for malignant neoplasm of breast: Secondary | ICD-10-CM

## 2018-03-05 ENCOUNTER — Encounter (HOSPITAL_COMMUNITY)
Admission: RE | Admit: 2018-03-05 | Discharge: 2018-03-05 | Disposition: A | Discharge status: Home / Self Care | Payer: BC Managed Care – PPO | Source: Ambulatory Visit | Attending: Internal Medicine | Admitting: Internal Medicine

## 2018-03-05 ENCOUNTER — Other Ambulatory Visit: Payer: Self-pay

## 2018-03-05 ENCOUNTER — Ambulatory Visit (HOSPITAL_COMMUNITY)
Admission: RE | Admit: 2018-03-05 | Discharge: 2018-03-05 | Disposition: A | Discharge status: Home / Self Care | Payer: BC Managed Care – PPO | Source: Ambulatory Visit | Attending: Family Medicine | Admitting: Family Medicine

## 2018-03-05 ENCOUNTER — Encounter (HOSPITAL_COMMUNITY): Payer: Self-pay

## 2018-03-05 DIAGNOSIS — Z1231 Encounter for screening mammogram for malignant neoplasm of breast: Secondary | ICD-10-CM | POA: Insufficient documentation

## 2018-03-05 HISTORY — DX: Polyneuropathy, unspecified: G62.9

## 2018-03-05 HISTORY — DX: Essential (primary) hypertension: I10

## 2018-03-05 LAB — CBC WITH DIFFERENTIAL/PLATELET
ABS IMMATURE GRANULOCYTES: 0.02 10*3/uL (ref 0.00–0.07)
BASOS ABS: 0 10*3/uL (ref 0.0–0.1)
Basophils Relative: 0 %
EOS ABS: 0.3 10*3/uL (ref 0.0–0.5)
Eosinophils Relative: 4 %
HEMATOCRIT: 41.8 % (ref 36.0–46.0)
HEMOGLOBIN: 13.3 g/dL (ref 12.0–15.0)
IMMATURE GRANULOCYTES: 0 %
LYMPHS ABS: 2.8 10*3/uL (ref 0.7–4.0)
LYMPHS PCT: 41 %
MCH: 29.6 pg (ref 26.0–34.0)
MCHC: 31.8 g/dL (ref 30.0–36.0)
MCV: 93.1 fL (ref 80.0–100.0)
Monocytes Absolute: 0.6 10*3/uL (ref 0.1–1.0)
Monocytes Relative: 9 %
NEUTROS PCT: 46 %
NRBC: 0 % (ref 0.0–0.2)
Neutro Abs: 3.1 10*3/uL (ref 1.7–7.7)
Platelets: 213 10*3/uL (ref 150–400)
RBC: 4.49 MIL/uL (ref 3.87–5.11)
RDW: 13.1 % (ref 11.5–15.5)
WBC: 6.9 10*3/uL (ref 4.0–10.5)

## 2018-03-05 LAB — BASIC METABOLIC PANEL
Anion gap: 9 (ref 5–15)
BUN: 14 mg/dL (ref 8–23)
CO2: 27 mmol/L (ref 22–32)
Calcium: 9.6 mg/dL (ref 8.9–10.3)
Chloride: 104 mmol/L (ref 98–111)
Creatinine, Ser: 0.77 mg/dL (ref 0.44–1.00)
GFR calc Af Amer: >60 mL/min (ref >60)
GLUCOSE: 78 mg/dL (ref 70–99)
Potassium: 3.3 mmol/L — ABNORMAL LOW (ref 3.5–5.1)
Sodium: 140 mmol/L (ref 135–145)

## 2018-03-07 ENCOUNTER — Encounter: Payer: Self-pay | Admitting: Internal Medicine

## 2018-03-07 NOTE — Progress Notes (Signed)
Labs reviewed from Dr. Lamar BlinksGolding's office dated 10/17/2017.  LFTs normal except for slightly elevated ALT no celiac screen.  We need to get a TTG IgA and a total serum IgA and at this point, a hepatic profile.

## 2018-03-07 NOTE — H&P (View-Only) (Signed)
Labs reviewed from Dr. Golding's office dated 10/17/2017.  LFTs normal except for slightly elevated ALT no celiac screen.  We need to get a TTG IgA and a total serum IgA and at this point, a hepatic profile. 

## 2018-03-10 ENCOUNTER — Telehealth: Payer: Self-pay | Admitting: Internal Medicine

## 2018-03-10 ENCOUNTER — Other Ambulatory Visit: Payer: Self-pay

## 2018-03-10 DIAGNOSIS — R740 Nonspecific elevation of levels of transaminase and lactic acid dehydrogenase [LDH]: Principal | ICD-10-CM

## 2018-03-10 DIAGNOSIS — R7401 Elevation of levels of liver transaminase levels: Secondary | ICD-10-CM

## 2018-03-10 NOTE — Telephone Encounter (Signed)
Pt was returning call from AM. Please call her back at [phone]

## 2018-03-10 NOTE — Progress Notes (Signed)
Lmom, waiting on a return call.  

## 2018-03-10 NOTE — Progress Notes (Signed)
Spoke with pt. Lab orders placed and she will have labs done at [facility] tomorrow.

## 2018-03-12 LAB — HEPATIC FUNCTION PANEL
ALBUMIN: 4.4 g/dL (ref 3.6–4.8)
ALK PHOS: 53 IU/L (ref 39–117)
ALT: 37 IU/L — ABNORMAL HIGH (ref 0–32)
AST: 33 IU/L (ref 0–40)
BILIRUBIN, DIRECT: 0.11 mg/dL (ref 0.00–0.40)
Bilirubin Total: 0.4 mg/dL (ref 0.0–1.2)
TOTAL PROTEIN: 7 g/dL (ref 6.0–8.5)

## 2018-03-12 LAB — TISSUE TRANSGLUTAMINASE, IGA: Transglutaminase IgA: <2 U/mL (ref 0–3)

## 2018-03-12 LAB — IGA: IgA/Immunoglobulin A, Serum: 165 mg/dL (ref 87–352)

## 2018-03-13 ENCOUNTER — Ambulatory Visit (HOSPITAL_COMMUNITY): Payer: BC Managed Care – PPO | Admitting: Anesthesiology

## 2018-03-13 ENCOUNTER — Encounter (HOSPITAL_COMMUNITY)
Admission: RE | Disposition: A | Discharge status: Home / Self Care | Payer: Self-pay | Source: Ambulatory Visit | Attending: Internal Medicine

## 2018-03-13 ENCOUNTER — Other Ambulatory Visit: Payer: Self-pay

## 2018-03-13 ENCOUNTER — Encounter (HOSPITAL_COMMUNITY): Payer: Self-pay | Admitting: Anesthesiology

## 2018-03-13 ENCOUNTER — Ambulatory Visit (HOSPITAL_COMMUNITY)
Admission: RE | Admit: 2018-03-13 | Discharge: 2018-03-13 | Disposition: A | Discharge status: Home / Self Care | Payer: BC Managed Care – PPO | Source: Ambulatory Visit | Attending: Internal Medicine | Admitting: Internal Medicine

## 2018-03-13 DIAGNOSIS — Z86010 Personal history of colonic polyps: Secondary | ICD-10-CM | POA: Insufficient documentation

## 2018-03-13 DIAGNOSIS — K5730 Diverticulosis of large intestine without perforation or abscess without bleeding: Secondary | ICD-10-CM | POA: Insufficient documentation

## 2018-03-13 DIAGNOSIS — I10 Essential (primary) hypertension: Secondary | ICD-10-CM | POA: Insufficient documentation

## 2018-03-13 DIAGNOSIS — Z1211 Encounter for screening for malignant neoplasm of colon: Secondary | ICD-10-CM | POA: Insufficient documentation

## 2018-03-13 DIAGNOSIS — R194 Change in bowel habit: Secondary | ICD-10-CM

## 2018-03-13 DIAGNOSIS — K219 Gastro-esophageal reflux disease without esophagitis: Secondary | ICD-10-CM | POA: Diagnosis not present

## 2018-03-13 DIAGNOSIS — Z87891 Personal history of nicotine dependence: Secondary | ICD-10-CM | POA: Diagnosis not present

## 2018-03-13 DIAGNOSIS — Z79899 Other long term (current) drug therapy: Secondary | ICD-10-CM | POA: Insufficient documentation

## 2018-03-13 HISTORY — PX: COLONOSCOPY WITH PROPOFOL: SHX5780

## 2018-03-13 HISTORY — PX: BIOPSY: SHX5522

## 2018-03-13 SURGERY — COLONOSCOPY WITH PROPOFOL
Anesthesia: General

## 2018-03-13 MED ORDER — PROPOFOL 10 MG/ML IV BOLUS
INTRAVENOUS | Status: AC
  Filled 2018-03-13: qty 20

## 2018-03-13 MED ORDER — LACTATED RINGERS IV SOLN
INTRAVENOUS | Status: DC
  Administered 2018-03-13: 13:00:00 via INTRAVENOUS

## 2018-03-13 MED ORDER — PROPOFOL 500 MG/50ML IV EMUL
INTRAVENOUS | Status: DC | PRN
  Administered 2018-03-13: 125 ug/kg/min via INTRAVENOUS

## 2018-03-13 MED ORDER — PROPOFOL 10 MG/ML IV BOLUS
INTRAVENOUS | Status: DC | PRN
  Administered 2018-03-13 (×3): 30 mg via INTRAVENOUS

## 2018-03-13 MED ORDER — CHLORHEXIDINE GLUCONATE CLOTH 2 % EX PADS: 6.0000 | MEDICATED_PAD | Freq: Once | CUTANEOUS | Status: DC

## 2018-03-13 MED ORDER — PROMETHAZINE HCL 25 MG/ML IJ SOLN: 6.2500 mg | INTRAMUSCULAR | Status: DC | PRN

## 2018-03-13 MED ORDER — MIDAZOLAM HCL 2 MG/2ML IJ SOLN: 0.5000 mg | Freq: Once | INTRAMUSCULAR | Status: DC | PRN

## 2018-03-13 MED ORDER — PROPOFOL 10 MG/ML IV BOLUS
INTRAVENOUS | Status: AC
  Filled 2018-03-13: qty 40

## 2018-03-13 MED ORDER — HYDROCODONE-ACETAMINOPHEN 7.5-325 MG PO TABS: 1.0000 | ORAL_TABLET | Freq: Once | ORAL | Status: DC | PRN

## 2018-03-13 MED ORDER — HYDROMORPHONE HCL 1 MG/ML IJ SOLN: 0.2500 mg | INTRAMUSCULAR | Status: DC | PRN

## 2018-03-13 NOTE — Interval H&P Note (Signed)
History and Physical Interval Note:  03/13/2018 12:44 PM  [name]  has presented today for surgery, with the diagnosis of hx of colon polyps, change in bowel habits  The various methods of treatment have been discussed with the patient and family. After consideration of risks, benefits and other options for treatment, the patient has consented to  Procedure(s) with comments: COLONOSCOPY WITH PROPOFOL (N/A) - 1:00pm-rescheduled to 11/21 @ 1:30pm per Mindy as a surgical intervention .  The patient's history has been reviewed, patient examined, no change in status, stable for surgery.  I have reviewed the patient's chart and labs.  Questions were answered to the patient's satisfaction.     [name]  Celiac panel negative.  Colonoscopy with segmental biopsies per plan today.  The risks, benefits, limitations, alternatives and imponderables have been reviewed with the patient. Questions have been answered. All parties are agreeable.

## 2018-03-13 NOTE — Transfer of Care (Signed)
Immediate Anesthesia Transfer of Care Note  Patient: [name]  Procedure(s) Performed: COLONOSCOPY WITH PROPOFOL (N/A ) BIOPSY  Patient Location: PACU  Anesthesia Type:General  Level of Consciousness: awake, alert  and oriented  Airway & Oxygen Therapy: Patient Spontanous Breathing  Post-op Assessment: Report given to RN and Post -op Vital signs reviewed and stable  Post vital signs: Reviewed and stable  Last Vitals:  Vitals Value Taken Time  BP 108/65 03/13/2018  1:11 PM  Temp 36.5 C 03/13/2018  1:11 PM  Pulse 72 03/13/2018  1:13 PM  Resp 22 03/13/2018  1:13 PM  SpO2 94 % 03/13/2018  1:13 PM  Vitals shown include unvalidated device data.  Last Pain:  Vitals:   03/13/18 1227  TempSrc: Oral  PainSc: 0-No pain      Patients Stated Pain Goal: 6 (03/13/18 1227)  Complications: No apparent anesthesia complications

## 2018-03-13 NOTE — Op Note (Addendum)
[facility] Patient Name: [name] Procedure Date: 03/13/2018 12:40 PM MRN: [id number] Date of Birth: [birth year] Attending MD: Gennette Pac , MD CSN: [id number] Age: 62 Admit Type: Outpatient Procedure:                Colonoscopy Indications:              High risk colon cancer surveillance: Personal                            history of colonic polyps; Chronic diarhea Providers:                Gennette Pac, MD, Nena Polio, RN, Sterling Big, RN Referring MD:              Medicines:                Propofol per Anesthesia Complications:            No immediate complications. Estimated Blood Loss:     Estimated blood loss was minimal. Procedure:                Pre-Anesthesia Assessment:                           - Prior to the procedure, a History and Physical                            was performed, and patient medications and                            allergies were reviewed. The patient's tolerance of                            previous anesthesia was also reviewed. The risks                            and benefits of the procedure and the sedation                            options and risks were discussed with the patient.                            All questions were answered, and informed consent                            was obtained. Prior Anticoagulants: The patient has                            taken no previous anticoagulant or antiplatelet                            agents. ASA Grade Assessment: II - A patient with  mild systemic disease. After reviewing the risks                            and benefits, the patient was deemed in                            satisfactory condition to undergo the procedure.                           After obtaining informed consent, the colonoscope                            was passed under direct vision. Throughout the                            procedure,  the patient's blood pressure, pulse, and                            oxygen saturations were monitored continuously. The                            [id number]-[id number] ([id number]) scope was introduced through                            the anus and advanced to the 5 cm into the ileum.                            The colonoscopy was performed without difficulty.                            The patient tolerated the procedure well. The                            quality of the bowel preparation was adequate. The                            terminal ileum, ileocecal valve, appendiceal                            orifice, and rectum were photographed. The entire                            colon was well visualized. Scope In: 12:47:47 PM Scope Out: 1:04:07 PM Scope Withdrawal Time: 0 hours 12 minutes 13 seconds  Total Procedure Duration: 0 hours 16 minutes 20 seconds  Findings:      The perianal and digital rectal examinations were normal.      Scattered small and large-mouthed diverticula were found in the entire       colon. .      The exam was otherwise without abnormality on direct and retroflexion       views. Distal 5 cm of terminal ileal mucosa appeared normal. Segmental       biopsies of the right and left colon to rule out microscopic colitis.       Estimated  blood loss was minimal Impression:               Status post segmental biopsy. Normal terminal ileum.                           - Diverticulosis in the entire examined colon.                            Biopsied.                           - The examination was otherwise normal on direct                            and retroflexion views. Moderate Sedation:      Moderate (conscious) sedation was personally administered by an       anesthesia professional. The following parameters were monitored: oxygen       saturation, heart rate, blood pressure, respiratory rate, EKG, adequacy       of pulmonary ventilation, and response to  care. Recommendation:           - Patient has a contact number available for                            emergencies. The signs and symptoms of potential                            delayed complications were discussed with the                            patient. Return to normal activities tomorrow.                            Written discharge instructions were provided to the                            patient.                           - Resume previous diet.                           - Continue present medications.                           - Await pathology results.                           - Repeat colonoscopy in 5 years for surveillance.                           - Return to GI office in 3 months. Procedure Code(s):        --- Professional ---                           [phone], Colonoscopy, flexible; with biopsy, single  or multiple Diagnosis Code(s):        --- Professional ---                           Z86.010, Personal history of colonic polyps                           K57.30, Diverticulosis of large intestine without                            perforation or abscess without bleeding CPT copyright 2018 [employer]. All rights reserved. The codes documented in this report are preliminary and upon coder review may  be revised to meet current compliance requirements. Gerrit Friendsobert M. Maleiya Pergola, MD Gennette Pacobert Michael Staisha Winiarski, MD 03/13/2018 1:18:54 PM This report has been signed electronically. Number of Addenda: 0

## 2018-03-13 NOTE — Discharge Instructions (Signed)
Colonoscopy Discharge Instructions  Read the instructions outlined below and refer to this sheet in the next few weeks. These discharge instructions provide you with general information on caring for yourself after you leave the hospital. Your doctor may also give you specific instructions. While your treatment has been planned according to the most current medical practices available, unavoidable complications occasionally occur. If you have any problems or questions after discharge, call Dr. Jena Gauss at [phone]. ACTIVITY  You may resume your regular activity, but move at a slower pace for the next 24 hours.   Take frequent rest periods for the next 24 hours.   Walking will help get rid of the air and reduce the bloated feeling in your belly (abdomen).   No driving for 24 hours (because of the medicine (anesthesia) used during the test).    Do not sign any important legal documents or operate any machinery for 24 hours (because of the anesthesia used during the test).  NUTRITION  Drink plenty of fluids.   You may resume your normal diet as instructed by your doctor.   Begin with a light meal and progress to your normal diet. Heavy or fried foods are harder to digest and may make you feel sick to your stomach (nauseated).   Avoid alcoholic beverages for 24 hours or as instructed.  MEDICATIONS  You may resume your normal medications unless your doctor tells you otherwise.  WHAT YOU CAN EXPECT TODAY  Some feelings of bloating in the abdomen.   Passage of more gas than usual.   Spotting of blood in your stool or on the toilet paper.  IF YOU HAD POLYPS REMOVED DURING THE COLONOSCOPY:  No aspirin products for 7 days or as instructed.   No alcohol for 7 days or as instructed.   Eat a soft diet for the next 24 hours.  FINDING OUT THE RESULTS OF YOUR TEST Not all test results are available during your visit. If your test results are not back during the visit, make an appointment  with your caregiver to find out the results. Do not assume everything is normal if you have not heard from your caregiver or the medical facility. It is important for you to follow up on all of your test results.  SEEK IMMEDIATE MEDICAL ATTENTION IF:  You have more than a spotting of blood in your stool.   Your belly is swollen (abdominal distention).   You are nauseated or vomiting.   You have a temperature over 101.   You have abdominal pain or discomfort that is severe or gets worse throughout the day.  Diverticulosis Diverticulosis is a condition that develops when small pouches (diverticula) form in the wall of the large intestine (colon). The colon is where water is absorbed and stool is formed. The pouches form when the inside layer of the colon pushes through weak spots in the outer layers of the colon. You may have a few pouches or many of them. What are the causes? The cause of this condition is not known. What increases the risk? The following factors may make you more likely to develop this condition:  Being older than age 15. Your risk for this condition increases with age. Diverticulosis is rare among people younger than age 75. By age 17, many people have it.  Eating a low-fiber diet.  Having frequent constipation.  Being overweight.  Not getting enough exercise.  Smoking.  Taking over-the-counter pain medicines, like aspirin and ibuprofen.  Having a family  history of diverticulosis.  What are the signs or symptoms? In most people, there are no symptoms of this condition. If you do have symptoms, they may include:  Bloating.  Cramps in the abdomen.  Constipation or diarrhea.  Pain in the lower left side of the abdomen.  How is this diagnosed? This condition is most often diagnosed during an exam for other colon problems. Because diverticulosis usually has no symptoms, it often cannot be diagnosed independently. This condition may be diagnosed  by:  Using a flexible scope to examine the colon (colonoscopy).  Taking an X-ray of the colon after dye has been put into the colon (barium enema).  Doing a CT scan.  How is this treated? You may not need treatment for this condition if you have never developed an infection related to diverticulosis. If you have had an infection before, treatment may include:  Eating a high-fiber diet. This may include eating more fruits, vegetables, and grains.  Taking a fiber supplement.  Taking a live bacteria supplement (probiotic).  Taking medicine to relax your colon.  Taking antibiotic medicines.  Follow these instructions at home:  Drink 6-8 glasses of water or more each day to prevent constipation.  Try not to strain when you have a bowel movement.  If you have had an infection before: ? Eat more fiber as directed by your health care provider or your diet and nutrition specialist (dietitian). ? Take a fiber supplement or probiotic, if your health care provider approves.  Take over-the-counter and prescription medicines only as told by your health care provider.  If you were prescribed an antibiotic, take it as told by your health care provider. Do not stop taking the antibiotic even if you start to feel better.  Keep all follow-up visits as told by your health care provider. This is important. Contact a health care provider if:  You have pain in your abdomen.  You have bloating.  You have cramps.  You have not had a bowel movement in 3 days. Get help right away if:  Your pain gets worse.  Your bloating becomes very bad.  You have a fever or chills, and your symptoms suddenly get worse.  You vomit.  You have bowel movements that are bloody or black.  You have bleeding from your rectum. Summary  Diverticulosis is a condition that develops when small pouches (diverticula) form in the wall of the large intestine (colon).  You may have a few pouches or many of  them.  This condition is most often diagnosed during an exam for other colon problems.  If you have had an infection related to diverticulosis, treatment may include increasing the fiber in your diet, taking supplements, or taking medicines. This information is not intended to replace advice given to you by your health care provider. Make sure you discuss any questions you have with your health care provider. Document Released: 01/05/2004 Document Revised: 02/27/2016 Document Reviewed: 02/27/2016 Elsevier Interactive Patient Education  2017 [employer]   Diverticulosis information provided  Further recommendations to follow pending review of pathology report  Office visit with us in 3 months

## 2018-03-13 NOTE — Anesthesia Preprocedure Evaluation (Signed)
Anesthesia Evaluation  Patient identified by MRN, date of birth, ID band Patient awake    Reviewed: Allergy & Precautions, NPO status , Patient's Chart, lab work & pertinent test results  Airway Mallampati: I  TM Distance: >3 FB Neck ROM: Full    Dental no notable dental hx. (+) Teeth Intact   Pulmonary neg pulmonary ROS, former smoker,    Pulmonary exam normal breath sounds clear to auscultation       Cardiovascular Exercise Tolerance: Good hypertension, negative cardio ROS Normal cardiovascular examI Rhythm:Regular Rate:Normal     Neuro/Psych negative neurological ROS  negative psych ROS   GI/Hepatic Neg liver ROS, GERD  Medicated and Controlled,occ GERD - none today - no current meds    Endo/Other  negative endocrine ROS  Renal/GU negative Renal ROS  negative genitourinary   Musculoskeletal negative musculoskeletal ROS (+)   Abdominal   Peds negative pediatric ROS (+)  Hematology negative hematology ROS (+)   Anesthesia Other Findings   Reproductive/Obstetrics negative OB ROS                             Anesthesia Physical Anesthesia Plan  ASA: II  Anesthesia Plan: General   Post-op Pain Management:    Induction: Intravenous  PONV Risk Score and Plan:   Airway Management Planned: Nasal Cannula and Simple Face Mask  Additional Equipment:   Intra-op Plan:   Post-operative Plan:   Informed Consent: I have reviewed the patients History and Physical, chart, labs and discussed the procedure including the risks, benefits and alternatives for the proposed anesthesia with the patient or authorized representative who has indicated his/her understanding and acceptance.   Dental advisory given  Plan Discussed with: CRNA  Anesthesia Plan Comments:         Anesthesia Quick Evaluation

## 2018-03-14 NOTE — Anesthesia Postprocedure Evaluation (Addendum)
Anesthesia Post Note  Patient: Iona CoachCathy M Reznick  Procedure(s) Performed: COLONOSCOPY WITH PROPOFOL (N/A ) BIOPSY  Patient location during evaluation: Short Stay Anesthesia Type: General Level of consciousness: awake and alert and patient cooperative Pain management: pain level controlled Vital Signs Assessment: post-procedure vital signs reviewed and stable Respiratory status: spontaneous breathing Cardiovascular status: stable Postop Assessment: no apparent nausea or vomiting Anesthetic complications: no Comments: Late entry 03/14/2018 1121     Last Vitals:  Vitals:   03/13/18 1330 03/13/18 1337  BP: 133/72 (!) 146/71  Pulse: (!) 57 (!) 50  Resp: 17   Temp:  36.4 C  SpO2: 96% 96%    Last Pain:  Vitals:   03/13/18 1337  TempSrc: Oral  PainSc: 0-No pain                 Yoav Okane

## 2018-03-16 ENCOUNTER — Encounter: Payer: Self-pay | Admitting: Internal Medicine

## 2018-03-19 ENCOUNTER — Encounter (HOSPITAL_COMMUNITY): Payer: Self-pay | Admitting: Internal Medicine

## 2018-06-16 ENCOUNTER — Ambulatory Visit: Payer: BC Managed Care – PPO | Admitting: Nurse Practitioner

## 2018-07-03 ENCOUNTER — Other Ambulatory Visit: Payer: Self-pay

## 2018-07-03 ENCOUNTER — Ambulatory Visit (INDEPENDENT_AMBULATORY_CARE_PROVIDER_SITE_OTHER): Payer: BC Managed Care – PPO | Admitting: Nurse Practitioner

## 2018-07-03 ENCOUNTER — Encounter: Payer: Self-pay | Admitting: Nurse Practitioner

## 2018-07-03 DIAGNOSIS — R194 Change in bowel habit: Secondary | ICD-10-CM

## 2018-07-03 DIAGNOSIS — Z86010 Personal history of colonic polyps: Secondary | ICD-10-CM | POA: Diagnosis not present

## 2018-07-03 NOTE — Progress Notes (Signed)
Referring Provider: Assunta Found, MD Primary Care Physician:  Assunta Found, MD Primary GI:  Dr. Jena Gauss  Chief Complaint  Patient presents with  . Follow-up    HPI:   [name] is a 63 y.o. female who presents for follow-up on bowel habits.  The patient was last seen in our office 02/14/2018 for the same as well as history of colon polyps.  At that time noted postprandial loose nonbloody stools specifically with bacon and eggs in the morning.  1-2 bowel movements a day.  No history consistent with artificial sweetener intake or lactose intolerance.  Multiple colonic adenomas removed in 2011 and failed to return for surveillance 2014.  Symptoms quite intermittent and doubtful infectious or inflammatory process.  Will irritable bowel syndrome, need screening for celiac disease and pancreatic exocrine insufficiency remained in the differential.  Ended colonoscopy due to history of colonic adenomas, request labs through primary care, refrain from stool softener until colonoscopy to observe for improvement.  Colonoscopy completed 04/23/2017 that found status post segmental biopsy, normal terminal ileum, diverticulosis in the entire examined colon.  Surgical pathology found the biopsies to be colonic mucosa without histopathological changes.  Recommended repeat colonoscopy in 5 years.  Labs completed 03/11/2018 found tissue transglutaminase IgA normal, IgA normal, hepatic function panel very mild bump in ALT at 37, otherwise normal.  Today she states she is doing well overall.  Denies abdominal pain, nausea, vomiting, hematochezia, melena.  She is trying to avoid triggers which tend to cause a change in her stools.  [name] and eggs still cause an issue and she has had to limit this.  She will only eat these if she has nowhere to be that morning.  Otherwise she is doing well overall.  Past Medical History:  Diagnosis Date  . Hypertension   . Neuropathy     Past Surgical History:  Procedure  Laterality Date  . ABDOMINAL HYSTERECTOMY    . BIOPSY  03/13/2018   Procedure: BIOPSY;  Surgeon: Corbin Ade, MD;  Location: [facility];  Service: Endoscopy;;  (colon)  . COLONOSCOPY WITH PROPOFOL N/A 03/13/2018   Procedure: COLONOSCOPY WITH PROPOFOL;  Surgeon: Corbin Ade, MD;  Location: [facility];  Service: Endoscopy;  Laterality: N/A;  1:00pm-rescheduled to 11/21 @ 1:30pm per Mindy    Current Outpatient Medications  Medication Sig Dispense Refill  . atorvastatin (LIPITOR) 10 MG tablet Take 15 mg by mouth at bedtime.   3  . Calcium Carb-Cholecalciferol (CALCIUM 600 + D PO) Take 1 tablet by mouth daily.    . Calcium Citrate-Vitamin D (CITRACAL PETITES/VITAMIN D PO) Take 2 tablets by mouth 2 (two) times daily.     . cetirizine (ZYRTEC) 10 MG tablet Take 10 mg by mouth as needed for allergies.    . folic acid (FOLVITE) 800 MCG tablet Take 800 mcg by mouth daily.     Marland Kitchen gabapentin (NEURONTIN) 300 MG capsule Take 300 mg by mouth 2 (two) times daily.  0  . hydrochlorothiazide (HYDRODIURIL) 25 MG tablet Take 25 mg by mouth daily.  1  . Multiple Vitamin (MULTIVITAMIN) tablet Take 1 tablet by mouth daily.    . Omega-3 Fatty Acids (FISH OIL) 1200 MG CAPS Take 1 capsule by mouth daily.     . potassium chloride SA (K-DUR,KLOR-CON) 20 MEQ tablet Take 20 mEq by mouth daily.  1  . vitamin B-12 (CYANOCOBALAMIN) 500 MCG tablet Take 500 mcg by mouth daily.      No  current facility-administered medications for this visit.     Allergies as of 07/03/2018  . (No Known Allergies)    History reviewed. No pertinent family history.  Social History   Socioeconomic History  . Marital status: Widowed    Spouse name: Not on file  . Number of children: Not on file  . Years of education: Not on file  . Highest education level: Not on file  Occupational History  . Not on file  Social Needs  . Financial resource strain: Not on file  . Food insecurity:    Worry: Not on file    Inability:  Not on file  . Transportation needs:    Medical: Not on file    Non-medical: Not on file  Tobacco Use  . Smoking status: Former Games developer  . Smokeless tobacco: Never Used  Substance and Sexual Activity  . Alcohol use: Not Currently    Frequency: Never  . Drug use: Never  . Sexual activity: Not on file  Lifestyle  . Physical activity:    Days per week: Not on file    Minutes per session: Not on file  . Stress: Not on file  Relationships  . Social connections:    Talks on phone: Not on file    Gets together: Not on file    Attends religious service: Not on file    Active member of club or organization: Not on file    Attends meetings of clubs or organizations: Not on file    Relationship status: Not on file  Other Topics Concern  . Not on file  Social History Narrative  . Not on file    Review of Systems: General: Negative for anorexia, weight loss, fever, chills, fatigue, weakness. ENT: Negative for hoarseness, difficulty swallowing. CV: Negative for chest pain, angina, palpitations, peripheral edema.  Respiratory: Negative for dyspnea at rest, cough, sputum, wheezing.  GI: See history of present illness. Endo: Negative for unusual weight change.  Heme: Negative for bruising or bleeding. Allergy: Negative for rash or hives.   Physical Exam: BP (!) 183/91   Pulse 60   Temp (!) 97.1 F (36.2 C) (Oral)   Ht 5\' 5"  (1.651 m)   Wt 201 lb 6.4 oz (91.4 kg)   BMI 33.51 kg/m  General:   Alert and oriented. Pleasant and cooperative. Well-nourished and well-developed.  Eyes:  Without icterus, sclera clear and conjunctiva pink.  Ears:  Normal auditory acuity. Cardiovascular:  S1, S2 present without murmurs appreciated. Extremities without clubbing or edema. Respiratory:  Clear to auscultation bilaterally. No wheezes, rales, or rhonchi. No distress.  Gastrointestinal:  +BS, soft, non-tender and non-distended. No HSM noted. No guarding or rebound. No masses appreciated.  Rectal:   Deferred  Musculoskalatal:  Symmetrical without gross deformities. Neurologic:  Alert and oriented x4;  grossly normal neurologically. Psych:  Alert and cooperative. Normal mood and affect. Heme/Lymph/Immune: No excessive bruising noted.    07/09/2018 3:21 PM   Disclaimer: This note was dictated with voice recognition software. Similar sounding words can inadvertently be transcribed and may not be corrected upon review.

## 2018-07-09 ENCOUNTER — Encounter: Payer: Self-pay | Admitting: Nurse Practitioner

## 2018-07-09 DIAGNOSIS — R194 Change in bowel habit: Secondary | ICD-10-CM | POA: Insufficient documentation

## 2018-07-09 DIAGNOSIS — Z86010 Personal history of colonic polyps: Secondary | ICD-10-CM | POA: Insufficient documentation

## 2018-07-09 NOTE — Assessment & Plan Note (Signed)
Previous history of colon polyps.  Her colonoscopy was updated 04/23/2017 status post segmental biopsy.  Surgical pathology found the biopsies to be colonic mucosa without histopathological changes.  Recommended repeat exam in 5 years (2024).  No polyps on this exam.  We will add her to the recall list for repeat colonoscopy.

## 2018-07-09 NOTE — Patient Instructions (Signed)
Your health issues we discussed today were:   Change in stools: 1. Continue your current medications 2. Continue to avoid triggers that cause symptoms, especially bacon and eggs. 3. Call us if you have any worsening symptoms  Recent colonoscopy: 1. We did not find any polyps at your most recent colonoscopy in 2019 2. We will plan for repeat colonoscopy in 5 years.  We will send you a letter when it is time to re-schedule this  Overall I recommend:  1. Return for follow-up in 6 months 2. Call us if you have any questions or concerns.    Because of recent events of COVID-19 ("Coronavirus"), follow CDC recommendations:  1. Wash your hand frequently 2. Avoid touching your face 3. Stay away from people who are sick 4. If you have symptoms such as fever, cough, shortness of breath then call your healthcare provider for further guidance 5. If you are sick, STAY AT HOME unless otherwise directed by your healthcare provider. 6. Follow directions from state and national officials regarding staying safe    At [facility] we value your feedback. You may receive a survey about your visit today. Please share your experience as we strive to create trusting relationships with our patients to provide genuine, compassionate, quality care.  We appreciate your understanding and patience as we review any laboratory studies, imaging, and other diagnostic tests that are ordered as we care for you. Our office policy is 5 business days for review of these results, and any emergent or urgent results are addressed in a timely manner for your best interest. If you do not hear from our office in 1 week, please contact us.   We also encourage the use of MyChart, which contains your medical information for your review as well. If you are not enrolled in this feature, an access code is on this after visit summary for your convenience. Thank you for allowing [place] to be involved in your care.  It was  great to see you today!  I hope you have a great day!!

## 2018-07-09 NOTE — Assessment & Plan Note (Signed)
Patient previously was complaining of postprandial loose, nonbloody stools especially with specific triggers.  Noted 1-2 bowel movements a day.  Celiac disease work-up normal.  Currently she is doing well.  Denies significant worsening of symptoms.  She has been trying to avoid triggers which tend to cause symptoms which is work well for her.  [name] and eggs are still a major trigger and she cannot eat these unless she has nowhere to be for a while.  Recommend she continue her current medications, continue to avoid triggers, follow-up in 6 months.  Call us for any worsening symptoms.

## 2018-07-10 NOTE — Progress Notes (Signed)
cc'd to pcp 

## 2019-01-06 ENCOUNTER — Ambulatory Visit: Payer: BC Managed Care – PPO | Admitting: Nurse Practitioner

## 2019-01-19 ENCOUNTER — Other Ambulatory Visit (HOSPITAL_COMMUNITY): Payer: Self-pay | Admitting: Family Medicine

## 2019-01-19 DIAGNOSIS — Z1231 Encounter for screening mammogram for malignant neoplasm of breast: Secondary | ICD-10-CM

## 2019-01-28 ENCOUNTER — Other Ambulatory Visit (HOSPITAL_COMMUNITY): Payer: Self-pay | Admitting: Family Medicine

## 2019-01-28 DIAGNOSIS — E2839 Other primary ovarian failure: Secondary | ICD-10-CM

## 2019-01-30 ENCOUNTER — Other Ambulatory Visit (HOSPITAL_COMMUNITY): Payer: Self-pay | Admitting: Family Medicine

## 2019-01-30 DIAGNOSIS — R590 Localized enlarged lymph nodes: Secondary | ICD-10-CM

## 2019-02-04 ENCOUNTER — Ambulatory Visit (HOSPITAL_COMMUNITY)
Admission: RE | Admit: 2019-02-04 | Discharge: 2019-02-04 | Disposition: A | Discharge status: Home / Self Care | Payer: Medicare Other | Source: Ambulatory Visit | Attending: Family Medicine | Admitting: Family Medicine

## 2019-02-04 ENCOUNTER — Other Ambulatory Visit: Payer: Self-pay

## 2019-02-04 DIAGNOSIS — E2839 Other primary ovarian failure: Secondary | ICD-10-CM

## 2019-02-10 ENCOUNTER — Other Ambulatory Visit (HOSPITAL_COMMUNITY): Payer: Self-pay | Admitting: Family Medicine

## 2019-02-10 DIAGNOSIS — N63 Unspecified lump in unspecified breast: Secondary | ICD-10-CM

## 2019-02-10 DIAGNOSIS — R590 Localized enlarged lymph nodes: Secondary | ICD-10-CM

## 2019-02-17 ENCOUNTER — Ambulatory Visit (HOSPITAL_COMMUNITY)
Admission: RE | Admit: 2019-02-17 | Discharge: 2019-02-17 | Disposition: A | Discharge status: Home / Self Care | Payer: Medicare Other | Source: Ambulatory Visit | Attending: Family Medicine | Admitting: Family Medicine

## 2019-02-17 ENCOUNTER — Other Ambulatory Visit: Payer: Self-pay

## 2019-02-17 DIAGNOSIS — R590 Localized enlarged lymph nodes: Secondary | ICD-10-CM | POA: Diagnosis not present

## 2019-04-20 ENCOUNTER — Other Ambulatory Visit (HOSPITAL_COMMUNITY): Payer: Self-pay | Admitting: Family Medicine

## 2019-04-20 DIAGNOSIS — Z1231 Encounter for screening mammogram for malignant neoplasm of breast: Secondary | ICD-10-CM

## 2019-04-23 ENCOUNTER — Other Ambulatory Visit: Payer: Self-pay

## 2019-04-23 ENCOUNTER — Ambulatory Visit (HOSPITAL_COMMUNITY)
Admission: RE | Admit: 2019-04-23 | Discharge: 2019-04-23 | Disposition: A | Discharge status: Home / Self Care | Payer: Medicare Other | Source: Ambulatory Visit | Attending: Family Medicine | Admitting: Family Medicine

## 2019-04-23 DIAGNOSIS — Z1231 Encounter for screening mammogram for malignant neoplasm of breast: Secondary | ICD-10-CM | POA: Insufficient documentation

## 2019-08-18 DIAGNOSIS — Z1389 Encounter for screening for other disorder: Secondary | ICD-10-CM | POA: Diagnosis not present

## 2019-08-18 DIAGNOSIS — E785 Hyperlipidemia, unspecified: Secondary | ICD-10-CM | POA: Diagnosis not present

## 2019-08-18 DIAGNOSIS — G608 Other hereditary and idiopathic neuropathies: Secondary | ICD-10-CM | POA: Diagnosis not present

## 2019-08-18 DIAGNOSIS — Z0001 Encounter for general adult medical examination with abnormal findings: Secondary | ICD-10-CM | POA: Diagnosis not present

## 2019-08-18 DIAGNOSIS — R7309 Other abnormal glucose: Secondary | ICD-10-CM | POA: Diagnosis not present

## 2019-08-18 DIAGNOSIS — I10 Essential (primary) hypertension: Secondary | ICD-10-CM | POA: Diagnosis not present

## 2019-08-18 DIAGNOSIS — Z6833 Body mass index (BMI) 33.0-33.9, adult: Secondary | ICD-10-CM | POA: Diagnosis not present

## 2020-04-01 ENCOUNTER — Other Ambulatory Visit (HOSPITAL_COMMUNITY): Payer: Self-pay | Admitting: Family Medicine

## 2020-04-01 DIAGNOSIS — Z1231 Encounter for screening mammogram for malignant neoplasm of breast: Secondary | ICD-10-CM

## 2020-04-14 DIAGNOSIS — E785 Hyperlipidemia, unspecified: Secondary | ICD-10-CM | POA: Diagnosis not present

## 2020-04-14 DIAGNOSIS — Z6832 Body mass index (BMI) 32.0-32.9, adult: Secondary | ICD-10-CM | POA: Diagnosis not present

## 2020-04-14 DIAGNOSIS — R7309 Other abnormal glucose: Secondary | ICD-10-CM | POA: Diagnosis not present

## 2020-04-14 DIAGNOSIS — I10 Essential (primary) hypertension: Secondary | ICD-10-CM | POA: Diagnosis not present

## 2020-04-14 DIAGNOSIS — Z86010 Personal history of colonic polyps: Secondary | ICD-10-CM | POA: Diagnosis not present

## 2020-04-14 DIAGNOSIS — G608 Other hereditary and idiopathic neuropathies: Secondary | ICD-10-CM | POA: Diagnosis not present

## 2020-04-14 DIAGNOSIS — Z23 Encounter for immunization: Secondary | ICD-10-CM | POA: Diagnosis not present

## 2020-04-18 ENCOUNTER — Other Ambulatory Visit: Payer: Self-pay

## 2020-04-18 ENCOUNTER — Ambulatory Visit (INDEPENDENT_AMBULATORY_CARE_PROVIDER_SITE_OTHER): Payer: Medicare PPO | Admitting: Podiatry

## 2020-04-18 ENCOUNTER — Ambulatory Visit (INDEPENDENT_AMBULATORY_CARE_PROVIDER_SITE_OTHER): Payer: Medicare PPO

## 2020-04-18 ENCOUNTER — Encounter: Payer: Self-pay | Admitting: Podiatry

## 2020-04-18 DIAGNOSIS — M79672 Pain in left foot: Secondary | ICD-10-CM

## 2020-04-18 DIAGNOSIS — L84 Corns and callosities: Secondary | ICD-10-CM | POA: Diagnosis not present

## 2020-04-18 DIAGNOSIS — M19072 Primary osteoarthritis, left ankle and foot: Secondary | ICD-10-CM

## 2020-04-18 DIAGNOSIS — M2140 Flat foot [pes planus] (acquired), unspecified foot: Secondary | ICD-10-CM

## 2020-04-20 NOTE — Progress Notes (Signed)
Subjective:   Patient ID: [name], female   DOB: 64 y.o.   MRN: [id number]   HPI Patient states she has very flatfoot deformity left with a lesion that is formed in the arch and states that she has had previous surgery done and knows that someday she will probably need something else done with it.  Patient does not smoke likes to be active   Review of Systems  All other systems reviewed and are negative.       Objective:  Physical Exam Vitals and nursing note reviewed.  Constitutional:      Appearance: She is well-developed and well-nourished.  Cardiovascular:     Pulses: Intact distal pulses.  Pulmonary:     Effort: Pulmonary effort is normal.  Musculoskeletal:        General: Normal range of motion.  Skin:    General: Skin is warm.  Neurological:     Mental Status: She is alert.     Neurovascular status intact muscle strength found to be adequate range of motion adequate with a rigid flatfoot deformity left with keratotic lesion within the arch that is painful when pressed and making walking difficult.  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  Patient will be seen back to recheck  X-rays indicate significant arthritis subtalar joint left rigid contracture noted

## 2020-04-29 ENCOUNTER — Ambulatory Visit (HOSPITAL_COMMUNITY)
Admission: RE | Admit: 2020-04-29 | Discharge: 2020-04-29 | Disposition: A | Discharge status: Home / Self Care | Payer: Medicare PPO | Source: Ambulatory Visit | Attending: Family Medicine | Admitting: Family Medicine

## 2020-04-29 ENCOUNTER — Other Ambulatory Visit: Payer: Self-pay

## 2020-04-29 DIAGNOSIS — Z1231 Encounter for screening mammogram for malignant neoplasm of breast: Secondary | ICD-10-CM | POA: Insufficient documentation

## 2020-05-05 ENCOUNTER — Other Ambulatory Visit: Payer: Self-pay | Admitting: Podiatry

## 2020-05-05 DIAGNOSIS — M19072 Primary osteoarthritis, left ankle and foot: Secondary | ICD-10-CM

## 2020-10-06 DIAGNOSIS — Z20822 Contact with and (suspected) exposure to covid-19: Secondary | ICD-10-CM | POA: Diagnosis not present

## 2020-10-10 DIAGNOSIS — J069 Acute upper respiratory infection, unspecified: Secondary | ICD-10-CM | POA: Diagnosis not present

## 2020-11-04 DIAGNOSIS — I10 Essential (primary) hypertension: Secondary | ICD-10-CM | POA: Diagnosis not present

## 2020-11-04 DIAGNOSIS — Z0001 Encounter for general adult medical examination with abnormal findings: Secondary | ICD-10-CM | POA: Diagnosis not present

## 2020-11-04 DIAGNOSIS — Z1389 Encounter for screening for other disorder: Secondary | ICD-10-CM | POA: Diagnosis not present

## 2020-11-04 DIAGNOSIS — Z1331 Encounter for screening for depression: Secondary | ICD-10-CM | POA: Diagnosis not present

## 2020-11-04 DIAGNOSIS — R7309 Other abnormal glucose: Secondary | ICD-10-CM | POA: Diagnosis not present

## 2020-11-04 DIAGNOSIS — E785 Hyperlipidemia, unspecified: Secondary | ICD-10-CM | POA: Diagnosis not present

## 2020-11-04 DIAGNOSIS — Z6832 Body mass index (BMI) 32.0-32.9, adult: Secondary | ICD-10-CM | POA: Diagnosis not present

## 2021-02-06 ENCOUNTER — Other Ambulatory Visit (HOSPITAL_COMMUNITY): Payer: Self-pay | Admitting: Family Medicine

## 2021-02-06 DIAGNOSIS — E2839 Other primary ovarian failure: Secondary | ICD-10-CM

## 2021-03-03 ENCOUNTER — Ambulatory Visit (HOSPITAL_COMMUNITY)
Admission: RE | Admit: 2021-03-03 | Discharge: 2021-03-03 | Disposition: A | Discharge status: Home / Self Care | Payer: Medicare PPO | Source: Ambulatory Visit | Attending: Family Medicine | Admitting: Family Medicine

## 2021-03-03 ENCOUNTER — Other Ambulatory Visit: Payer: Self-pay

## 2021-03-03 DIAGNOSIS — E2839 Other primary ovarian failure: Secondary | ICD-10-CM | POA: Insufficient documentation

## 2021-03-03 DIAGNOSIS — Z780 Asymptomatic menopausal state: Secondary | ICD-10-CM | POA: Diagnosis not present

## 2021-03-03 DIAGNOSIS — Z23 Encounter for immunization: Secondary | ICD-10-CM | POA: Diagnosis not present

## 2021-04-14 DIAGNOSIS — G629 Polyneuropathy, unspecified: Secondary | ICD-10-CM | POA: Diagnosis not present

## 2021-04-14 DIAGNOSIS — I10 Essential (primary) hypertension: Secondary | ICD-10-CM | POA: Diagnosis not present

## 2021-04-14 DIAGNOSIS — Z87891 Personal history of nicotine dependence: Secondary | ICD-10-CM | POA: Diagnosis not present

## 2021-04-14 DIAGNOSIS — E669 Obesity, unspecified: Secondary | ICD-10-CM | POA: Diagnosis not present

## 2021-04-14 DIAGNOSIS — Z825 Family history of asthma and other chronic lower respiratory diseases: Secondary | ICD-10-CM | POA: Diagnosis not present

## 2021-04-14 DIAGNOSIS — Z809 Family history of malignant neoplasm, unspecified: Secondary | ICD-10-CM | POA: Diagnosis not present

## 2021-04-14 DIAGNOSIS — Z833 Family history of diabetes mellitus: Secondary | ICD-10-CM | POA: Diagnosis not present

## 2021-04-14 DIAGNOSIS — E785 Hyperlipidemia, unspecified: Secondary | ICD-10-CM | POA: Diagnosis not present

## 2021-04-28 ENCOUNTER — Encounter: Payer: Self-pay | Admitting: Podiatry

## 2021-04-28 ENCOUNTER — Ambulatory Visit: Payer: Medicare PPO | Admitting: Podiatry

## 2021-04-28 ENCOUNTER — Other Ambulatory Visit: Payer: Self-pay

## 2021-04-28 DIAGNOSIS — L84 Corns and callosities: Secondary | ICD-10-CM | POA: Diagnosis not present

## 2021-04-28 DIAGNOSIS — M79675 Pain in left toe(s): Secondary | ICD-10-CM

## 2021-04-28 DIAGNOSIS — B351 Tinea unguium: Secondary | ICD-10-CM | POA: Diagnosis not present

## 2021-05-01 ENCOUNTER — Other Ambulatory Visit (HOSPITAL_COMMUNITY): Payer: Self-pay | Admitting: Family Medicine

## 2021-05-01 DIAGNOSIS — Z1231 Encounter for screening mammogram for malignant neoplasm of breast: Secondary | ICD-10-CM

## 2021-05-01 NOTE — Progress Notes (Signed)
Subjective:   Patient ID: [name], female   DOB: 66 y.o.   MRN: [id number]   HPI Patient presents with chronic painful lesions of both feet that she cannot cut herself and very thickened hallux nail left that is painful when pressed making shoe gear difficult and she cannot take care of   ROS      Objective:  Physical Exam  Neurovascular status intact with chronic lesion plantar aspect fifth metatarsal both feet painful when pressed and thick yellow brittle nailbeds left hallux painful     Assessment:  Chronic corn callus formation with pain bilateral along with mycotic nail infection left with pain     Plan:  Debrided nailbed left with reduction of the discomfort and debrided lesions bilateral no iatrogenic bleeding reappoint routine care

## 2021-05-04 ENCOUNTER — Other Ambulatory Visit: Payer: Self-pay

## 2021-05-04 ENCOUNTER — Ambulatory Visit (HOSPITAL_COMMUNITY)
Admission: RE | Admit: 2021-05-04 | Discharge: 2021-05-04 | Disposition: A | Discharge status: Home / Self Care | Payer: Medicare PPO | Source: Ambulatory Visit | Attending: Family Medicine | Admitting: Family Medicine

## 2021-05-04 DIAGNOSIS — Z1231 Encounter for screening mammogram for malignant neoplasm of breast: Secondary | ICD-10-CM | POA: Insufficient documentation

## 2021-07-12 DIAGNOSIS — I10 Essential (primary) hypertension: Secondary | ICD-10-CM | POA: Diagnosis not present

## 2021-07-12 DIAGNOSIS — E6609 Other obesity due to excess calories: Secondary | ICD-10-CM | POA: Diagnosis not present

## 2021-07-12 DIAGNOSIS — Z6834 Body mass index (BMI) 34.0-34.9, adult: Secondary | ICD-10-CM | POA: Diagnosis not present

## 2021-07-12 DIAGNOSIS — E785 Hyperlipidemia, unspecified: Secondary | ICD-10-CM | POA: Diagnosis not present

## 2021-07-12 DIAGNOSIS — R7309 Other abnormal glucose: Secondary | ICD-10-CM | POA: Diagnosis not present

## 2021-08-06 IMAGING — MG DIGITAL SCREENING BILAT W/ TOMO W/ CAD
6 of 10 series · 6 of 30 positions shown · non-contrast
Comparison: Previous exam(s).

CLINICAL DATA: Screening.

EXAM:
DIGITAL SCREENING BILATERAL MAMMOGRAM WITH TOMO AND CAD

[R CC synth-2D]
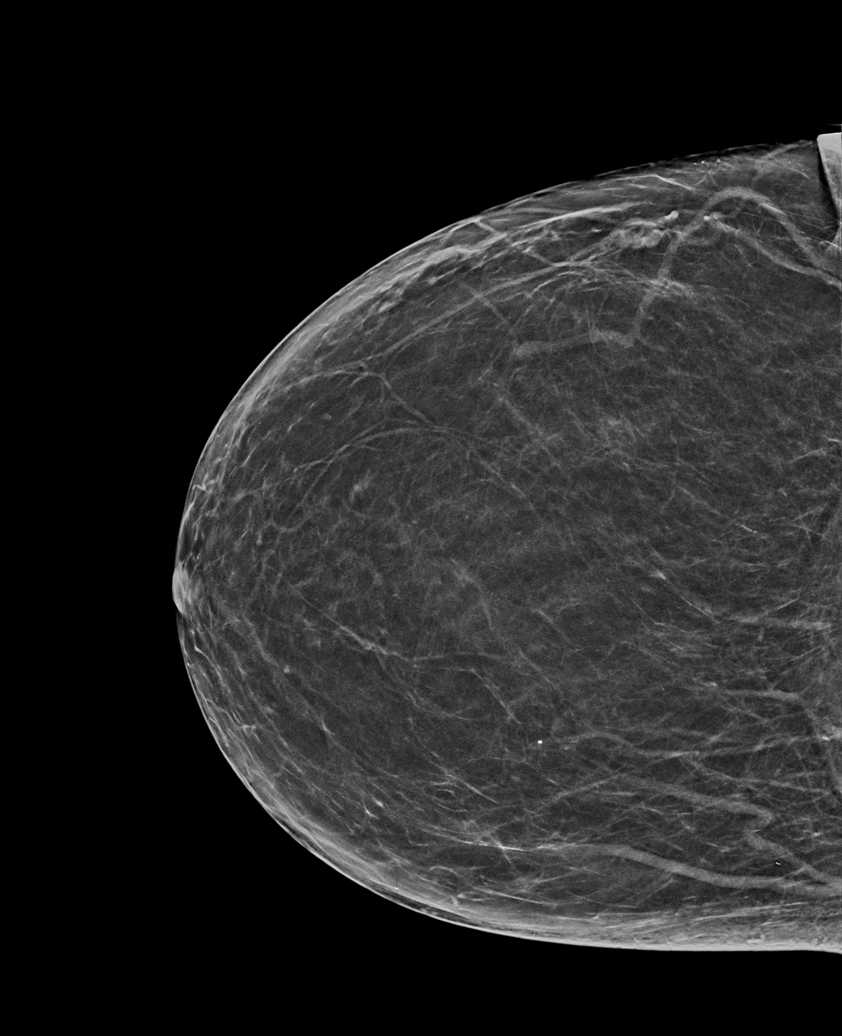

[R MLO synth-2D (1 of 2)]
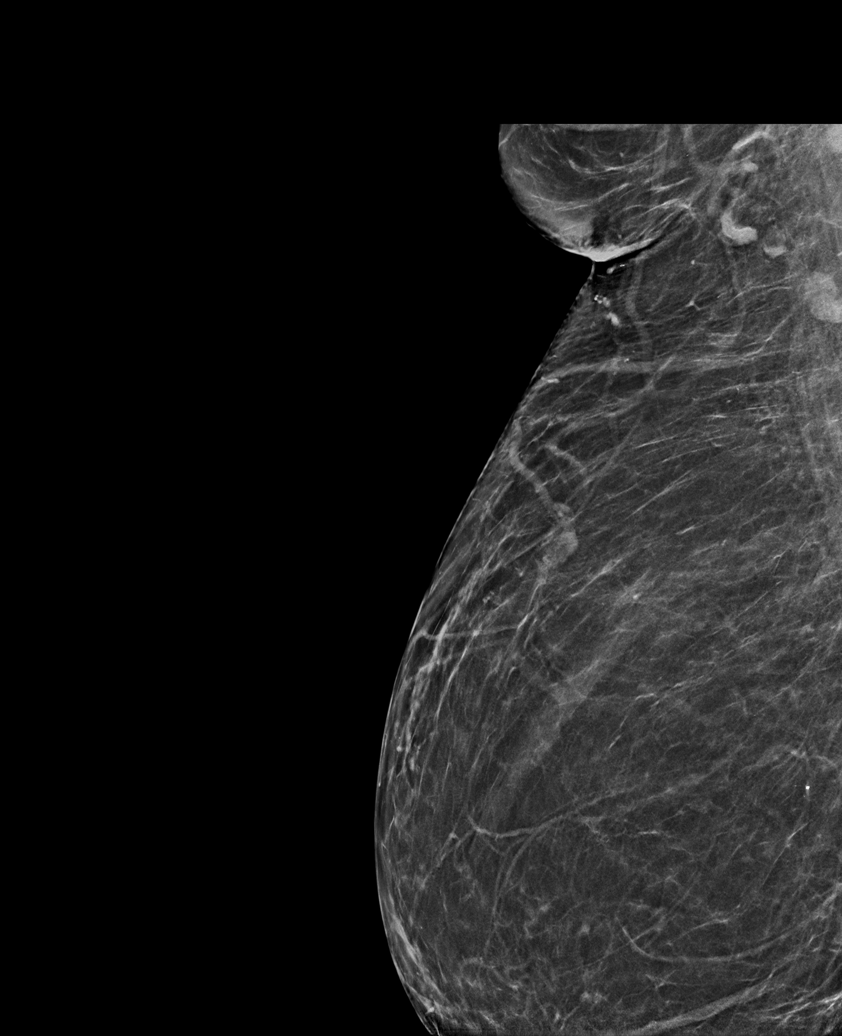

[L MLO synth-2D]
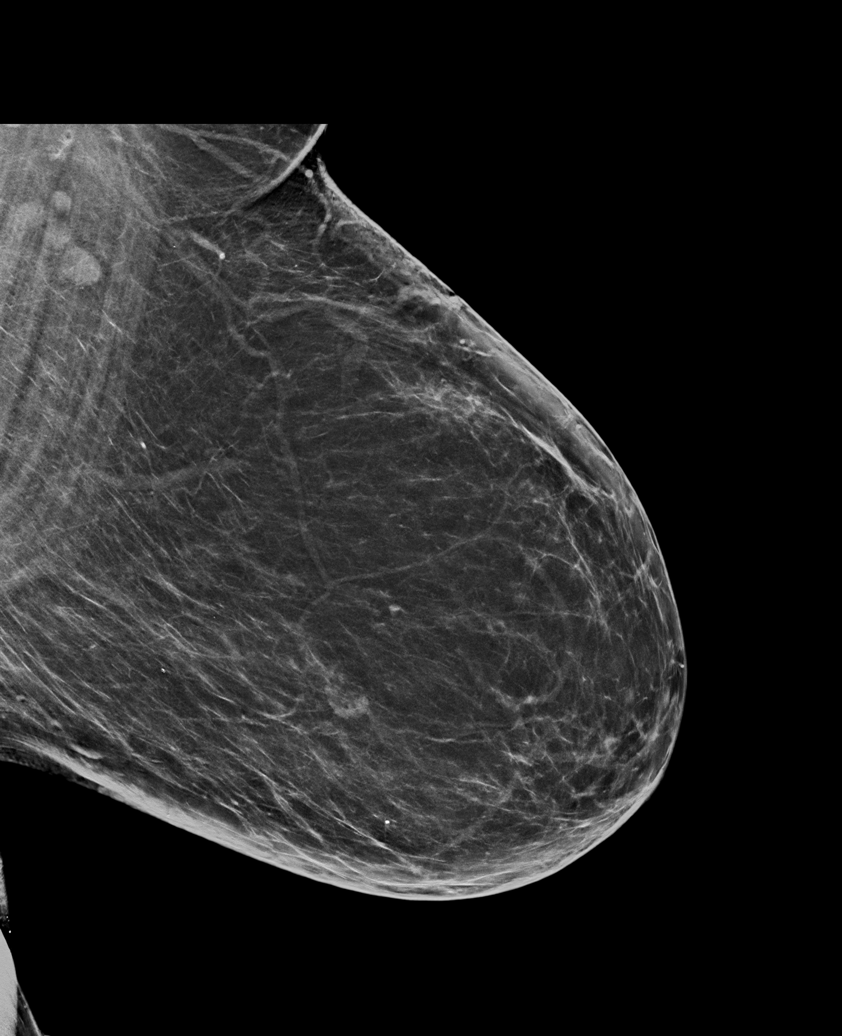

[R MLO synth-2D (2 of 2)]
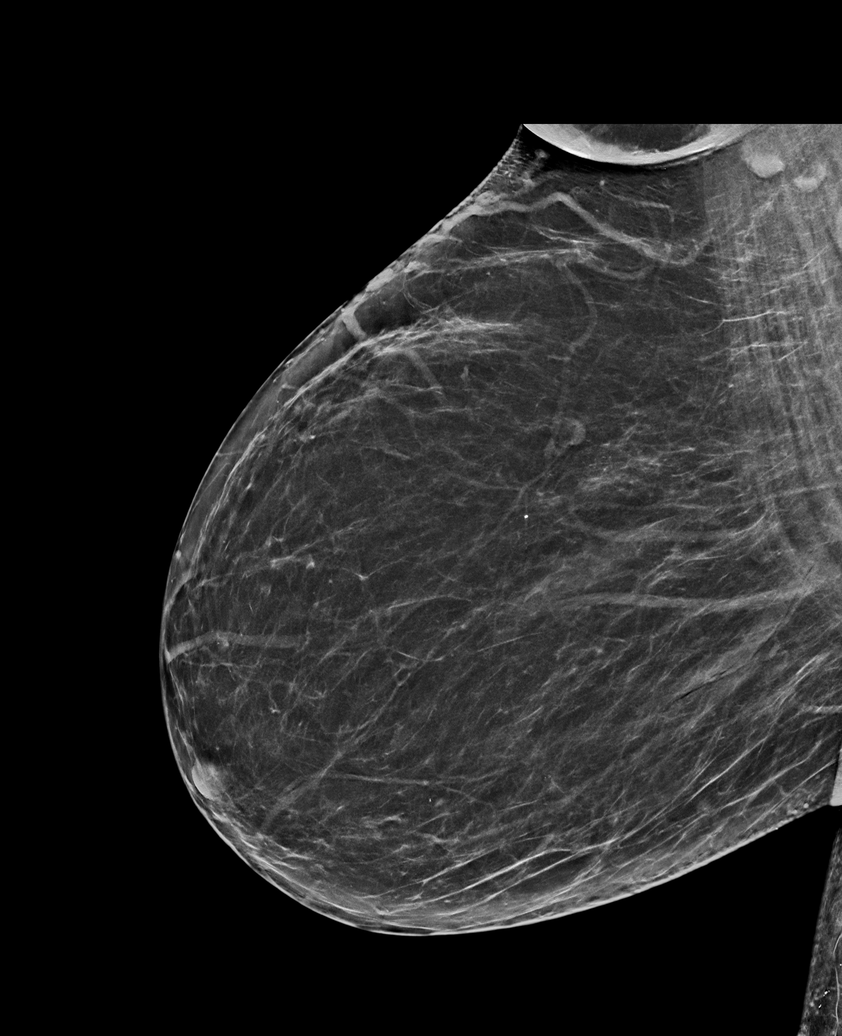

[L CC synth-2D]
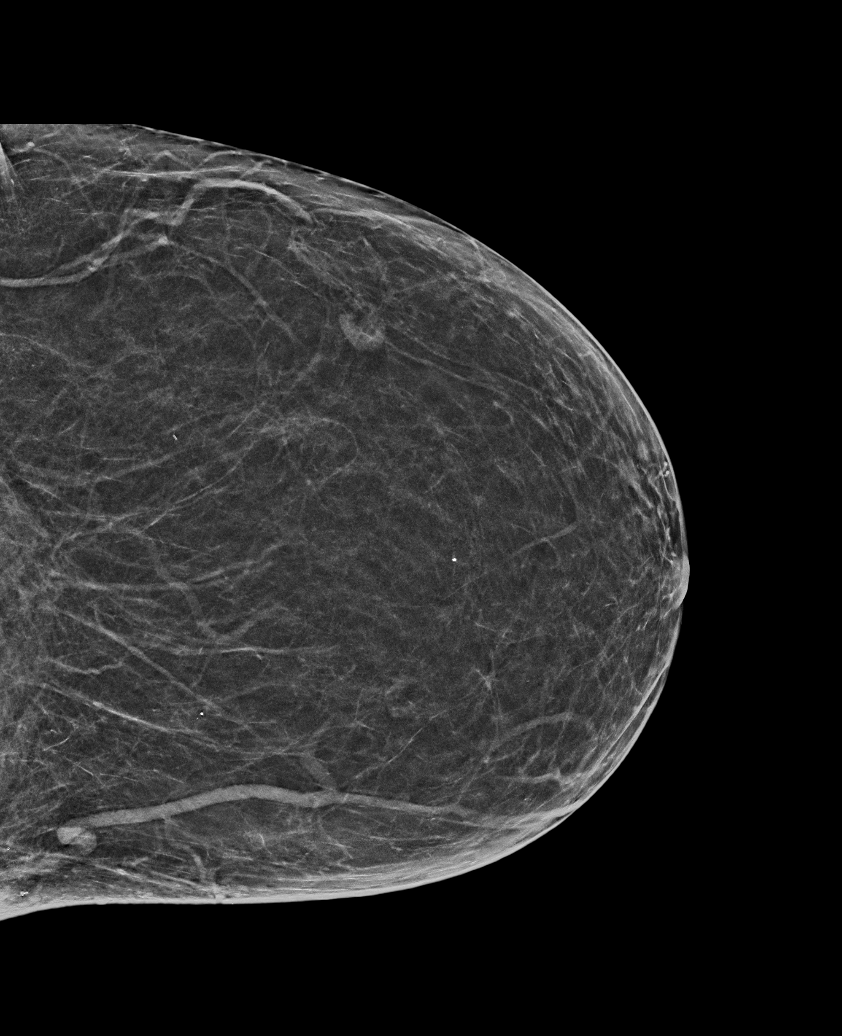

[R CC tomo · tomo slice 31/62.0]
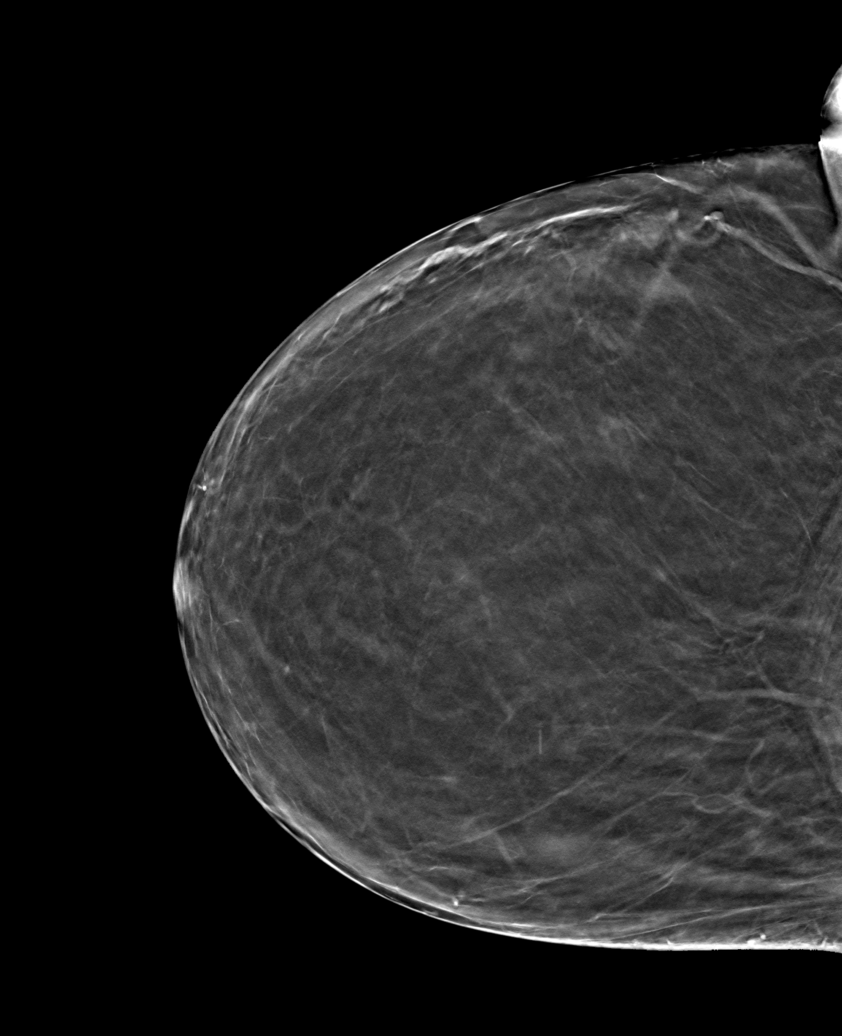

[6 of 30 positions shown; findings below may reference images not displayed]

ACR Breast Density Category b: There are scattered areas of
fibroglandular density.
FINDINGS: There are no findings suspicious for malignancy. Images were
processed with CAD.
IMPRESSION: No mammographic evidence of malignancy. A result letter of this
screening mammogram will be mailed directly to the patient.

RECOMMENDATION:
Screening mammogram in one year. (Code:[id number])

BI-RADS CATEGORY  1: Negative.

## 2021-08-08 DIAGNOSIS — M1712 Unilateral primary osteoarthritis, left knee: Secondary | ICD-10-CM | POA: Diagnosis not present

## 2021-08-08 DIAGNOSIS — E6609 Other obesity due to excess calories: Secondary | ICD-10-CM | POA: Diagnosis not present

## 2021-08-08 DIAGNOSIS — Z6834 Body mass index (BMI) 34.0-34.9, adult: Secondary | ICD-10-CM | POA: Diagnosis not present

## 2021-09-07 ENCOUNTER — Other Ambulatory Visit (HOSPITAL_COMMUNITY): Payer: Self-pay | Admitting: Family Medicine

## 2021-09-07 DIAGNOSIS — M1712 Unilateral primary osteoarthritis, left knee: Secondary | ICD-10-CM

## 2021-09-27 ENCOUNTER — Ambulatory Visit (HOSPITAL_COMMUNITY)
Admission: RE | Admit: 2021-09-27 | Discharge: 2021-09-27 | Disposition: A | Discharge status: Home / Self Care | Payer: Medicare PPO | Source: Ambulatory Visit | Attending: Family Medicine | Admitting: Family Medicine

## 2021-09-27 DIAGNOSIS — M1712 Unilateral primary osteoarthritis, left knee: Secondary | ICD-10-CM | POA: Diagnosis not present

## 2021-09-27 DIAGNOSIS — M25562 Pain in left knee: Secondary | ICD-10-CM | POA: Diagnosis not present

## 2021-11-15 DIAGNOSIS — M1712 Unilateral primary osteoarthritis, left knee: Secondary | ICD-10-CM | POA: Diagnosis not present

## 2021-12-06 ENCOUNTER — Encounter: Payer: Self-pay | Admitting: Podiatry

## 2021-12-06 ENCOUNTER — Ambulatory Visit: Payer: Medicare PPO | Admitting: Podiatry

## 2021-12-06 DIAGNOSIS — L84 Corns and callosities: Secondary | ICD-10-CM

## 2021-12-06 NOTE — Progress Notes (Signed)
Subjective:   Patient ID: [name], female   DOB: 66 y.o.   MRN: [id number]   HPI Patient presents with chronic lesions underneath the left foot which are very tender   ROS      Objective:  Physical Exam  Neurovascular status intact with 2 separate lesions of the left subright and second digit bilateral painful     Assessment:  Chronic keratotic lesions with pain     Plan:  Sterile sharp debridement no angiogenic bleeding reappoint routine care

## 2021-12-22 DIAGNOSIS — M1712 Unilateral primary osteoarthritis, left knee: Secondary | ICD-10-CM | POA: Diagnosis not present

## 2021-12-22 DIAGNOSIS — E785 Hyperlipidemia, unspecified: Secondary | ICD-10-CM | POA: Diagnosis not present

## 2021-12-22 DIAGNOSIS — E119 Type 2 diabetes mellitus without complications: Secondary | ICD-10-CM | POA: Diagnosis not present

## 2021-12-22 DIAGNOSIS — Z6833 Body mass index (BMI) 33.0-33.9, adult: Secondary | ICD-10-CM | POA: Diagnosis not present

## 2021-12-22 DIAGNOSIS — I10 Essential (primary) hypertension: Secondary | ICD-10-CM | POA: Diagnosis not present

## 2021-12-22 DIAGNOSIS — Z01818 Encounter for other preprocedural examination: Secondary | ICD-10-CM | POA: Diagnosis not present

## 2021-12-22 DIAGNOSIS — R7309 Other abnormal glucose: Secondary | ICD-10-CM | POA: Diagnosis not present

## 2021-12-22 DIAGNOSIS — E6609 Other obesity due to excess calories: Secondary | ICD-10-CM | POA: Diagnosis not present

## 2021-12-26 NOTE — Progress Notes (Addendum)
Anesthesia Review:  PCP: DR Assunta Found Called and requested most recent ov note and ekg from office.  Requested clearance from sherry at  [facility] .  She is to fax  LOV 12/22/21 on chart.  Clearance in note. MOst recent ekg from office on chart- 08/17/2010  Clearance 12/22/21 on chart.  Cardiologist : none  Chest x-ray : EKG : 12/28/21  Echo : Stress test: Cardiac Cath :  Activity level: can do a flight of stairs without difficulty  Sleep Study/ CPAP : none  Fasting Blood Sugar :      / Checks Blood Sugar -- times a day:   Blood Thinner/ Instructions /Last Dose: ASA / Instructions/ Last Dose :   Prediabetes- does not check glucose at home  Hgba1c- 12/28/21- 6.0  BMP done 12/28/21 routed to DR Charlann Boxer.

## 2021-12-27 NOTE — Patient Instructions (Signed)
SURGICAL WAITING ROOM VISITATION Patients having surgery or a procedure may have no more than 2 support people in the waiting area - these visitors may rotate.   Children under the age of 46 must have an adult with them who is not the patient. If the patient needs to stay at the hospital during part of their recovery, the visitor guidelines for inpatient rooms apply. Pre-op nurse will coordinate an appropriate time for 1 support person to accompany patient in pre-op.  This support person may not rotate.    Please refer to the [facility] website for the visitor guidelines for Inpatients (after your surgery is over and you are in a regular room).       Your procedure is scheduled on:  01/09/2022    Report to [facility] Main Entrance    Report to admitting at   [phone]   Call this number if you have problems the morning of surgery [phone]   Do not eat food :After Midnight.   After Midnight you may have the following liquids until __ 0415____ AM DAY OF SURGERY  Water Non-Citrus Juices (without pulp, NO RED) Carbonated Beverages Black Coffee (NO MILK/CREAM OR CREAMERS, sugar ok)  Clear Tea (NO MILK/CREAM OR CREAMERS, sugar ok) regular and decaf                             Plain Jell-O (NO RED)                                           Fruit ices (not with fruit pulp, NO RED)                                     Popsicles (NO RED)                                                               Sports drinks like Gatorade (NO RED)                    The day of surgery:  Drink ONE (1) Pre-Surgery Clear Ensure or G2 at   0415 AM ( have completed by )  the morning of surgery. Drink in one sitting. Do not sip.  This drink was given to you during your hospital  pre-op appointment visit. Nothing else to drink after completing the  Pre-Surgery Clear Ensure or G2.          If you have questions, please contact your surgeon's office.   FOLLOW BOWEL PREP AND ANY ADDITIONAL PRE  OP INSTRUCTIONS YOU RECEIVED FROM YOUR SURGEON'S OFFICE!!!     Oral Hygiene is also important to reduce your risk of infection.                                    Remember - BRUSH YOUR TEETH THE MORNING OF SURGERY WITH YOUR REGULAR TOOTHPASTE   Do NOT smoke after Midnight   Take these medicines  the morning of surgery with A SIP OF WATER:  gabapentin   DO NOT TAKE ANY ORAL DIABETIC MEDICATIONS DAY OF YOUR SURGERY  Bring CPAP mask and tubing day of surgery.                              You may not have any metal on your body including hair pins, jewelry, and body piercing             Do not wear make-up, lotions, powders, perfumes/cologne, or deodorant  Do not wear nail polish including gel and S&S, artificial/acrylic nails, or any other type of covering on natural nails including finger and toenails. If you have artificial nails, gel coating, etc. that needs to be removed by a nail salon please have this removed prior to surgery or surgery may need to be canceled/ delayed if the surgeon/ anesthesia feels like they are unable to be safely monitored.   Do not shave  48 hours prior to surgery.               Men may shave face and neck.   Do not bring valuables to the hospital. [facility] IS NOT             RESPONSIBLE   FOR VALUABLES.   Contacts, dentures or bridgework may not be worn into surgery.   Bring small overnight bag day of surgery.   DO NOT BRING YOUR HOME MEDICATIONS TO THE HOSPITAL. PHARMACY WILL DISPENSE MEDICATIONS LISTED ON YOUR MEDICATION LIST TO YOU DURING YOUR ADMISSION IN THE HOSPITAL!    Patients discharged on the day of surgery will not be allowed to drive home.  Someone NEEDS to stay with you for the first 24 hours after anesthesia.   Special Instructions: Bring a copy of your healthcare power of attorney and living will documents         the day of surgery if you haven't scanned them before.              Please read over the following fact sheets you were  given: IF YOU HAVE QUESTIONS ABOUT YOUR PRE-OP INSTRUCTIONS PLEASE CALL [phone]     [facility] - Preparing for Surgery Before surgery, you can play an important role.  Because skin is not sterile, your skin needs to be as free of germs as possible.  You can reduce the number of germs on your skin by washing with CHG (chlorahexidine gluconate) soap before surgery.  CHG is an antiseptic cleaner which kills germs and bonds with the skin to continue killing germs even after washing. Please DO NOT use if you have an allergy to CHG or antibacterial soaps.  If your skin becomes reddened/irritated stop using the CHG and inform your nurse when you arrive at Short Stay. Do not shave (including legs and underarms) for at least 48 hours prior to the first CHG shower.  You may shave your face/neck. Please follow these instructions carefully:  1.  Shower with CHG Soap the night before surgery and the  morning of Surgery.  2.  If you choose to wash your hair, wash your hair first as usual with your  normal  shampoo.  3.  After you shampoo, rinse your hair and body thoroughly to remove the  shampoo.  4.  Use CHG as you would any other liquid soap.  You can apply chg directly  to the skin and wash                       Gently with a scrungie or clean washcloth.  5.  Apply the CHG Soap to your body ONLY FROM THE NECK DOWN.   Do not use on face/ open                           Wound or open sores. Avoid contact with eyes, ears mouth and genitals (private parts).                       Wash face,  Genitals (private parts) with your normal soap.             6.  Wash thoroughly, paying special attention to the area where your surgery  will be performed.  7.  Thoroughly rinse your body with warm water from the neck down.  8.  DO NOT shower/wash with your normal soap after using and rinsing off  the CHG Soap.                9.  Pat yourself dry with a clean towel.            10.  Wear clean  pajamas.            11.  Place clean sheets on your bed the night of your first shower and do not  sleep with pets. Day of Surgery : Do not apply any lotions/deodorants the morning of surgery.  Please wear clean clothes to the hospital/surgery center.  FAILURE TO FOLLOW THESE INSTRUCTIONS MAY RESULT IN THE CANCELLATION OF YOUR SURGERY PATIENT SIGNATURE_________________________________  NURSE SIGNATURE__________________________________  ________________________________________________________________________

## 2021-12-28 ENCOUNTER — Encounter (HOSPITAL_COMMUNITY): Payer: Self-pay

## 2021-12-28 ENCOUNTER — Encounter (HOSPITAL_COMMUNITY)
Admission: RE | Admit: 2021-12-28 | Discharge: 2021-12-28 | Disposition: A | Discharge status: Home / Self Care | Payer: Medicare PPO | Source: Ambulatory Visit | Attending: Orthopedic Surgery | Admitting: Orthopedic Surgery

## 2021-12-28 ENCOUNTER — Other Ambulatory Visit: Payer: Self-pay

## 2021-12-28 VITALS — BP 123/80 | HR 69 | Temp 98.4°F | Resp 16 | Ht 65.0 in | Wt 195.0 lb

## 2021-12-28 DIAGNOSIS — Z01818 Encounter for other preprocedural examination: Secondary | ICD-10-CM

## 2021-12-28 DIAGNOSIS — M1712 Unilateral primary osteoarthritis, left knee: Secondary | ICD-10-CM | POA: Diagnosis not present

## 2021-12-28 DIAGNOSIS — R7303 Prediabetes: Secondary | ICD-10-CM | POA: Diagnosis not present

## 2021-12-28 HISTORY — DX: Unspecified osteoarthritis, unspecified site: M19.90

## 2021-12-28 HISTORY — DX: Prediabetes: R73.03

## 2021-12-28 HISTORY — DX: Gastro-esophageal reflux disease without esophagitis: K21.9

## 2021-12-28 HISTORY — DX: Pneumonia, unspecified organism: J18.9

## 2021-12-28 LAB — HEMOGLOBIN A1C
Hgb A1c MFr Bld: 6 % — ABNORMAL HIGH (ref 4.8–5.6)
Mean Plasma Glucose: 125.5 mg/dL

## 2021-12-28 LAB — CBC
HCT: 42.3 % (ref 36.0–46.0)
Hemoglobin: 13.5 g/dL (ref 12.0–15.0)
MCH: 30.4 pg (ref 26.0–34.0)
MCHC: 31.9 g/dL (ref 30.0–36.0)
MCV: 95.3 fL (ref 80.0–100.0)
Platelets: 224 10*3/uL (ref 150–400)
RBC: 4.44 MIL/uL (ref 3.87–5.11)
RDW: 13.3 % (ref 11.5–15.5)
WBC: 10.4 10*3/uL (ref 4.0–10.5)
nRBC: 0 % (ref 0.0–0.2)

## 2021-12-28 LAB — BASIC METABOLIC PANEL
Anion gap: 9 (ref 5–15)
BUN: 31 mg/dL — ABNORMAL HIGH (ref 8–23)
CO2: 28 mmol/L (ref 22–32)
Calcium: 10.2 mg/dL (ref 8.9–10.3)
Chloride: 103 mmol/L (ref 98–111)
Creatinine, Ser: 1 mg/dL (ref 0.44–1.00)
GFR, Estimated: >60 mL/min (ref >60)
Glucose, Bld: 100 mg/dL — ABNORMAL HIGH (ref 70–99)
Potassium: 3.3 mmol/L — ABNORMAL LOW (ref 3.5–5.1)
Sodium: 140 mmol/L (ref 135–145)

## 2021-12-28 LAB — SURGICAL PCR SCREEN
MRSA, PCR: NEGATIVE
Staphylococcus aureus: NEGATIVE

## 2021-12-28 LAB — GLUCOSE, CAPILLARY: Glucose-Capillary: 109 mg/dL — ABNORMAL HIGH (ref 70–99)

## 2021-12-28 LAB — TYPE AND SCREEN
ABO/RH(D): A POS
Antibody Screen: NEGATIVE

## 2021-12-29 ENCOUNTER — Encounter (HOSPITAL_COMMUNITY): Payer: Self-pay | Admitting: Physician Assistant

## 2022-01-01 DIAGNOSIS — E6609 Other obesity due to excess calories: Secondary | ICD-10-CM | POA: Diagnosis not present

## 2022-01-01 DIAGNOSIS — L03116 Cellulitis of left lower limb: Secondary | ICD-10-CM | POA: Diagnosis not present

## 2022-01-01 DIAGNOSIS — Z6833 Body mass index (BMI) 33.0-33.9, adult: Secondary | ICD-10-CM | POA: Diagnosis not present

## 2022-01-02 DIAGNOSIS — L03116 Cellulitis of left lower limb: Secondary | ICD-10-CM | POA: Diagnosis not present

## 2022-01-03 ENCOUNTER — Ambulatory Visit (INDEPENDENT_AMBULATORY_CARE_PROVIDER_SITE_OTHER): Payer: Medicare PPO

## 2022-01-03 ENCOUNTER — Encounter: Payer: Self-pay | Admitting: Orthopaedic Surgery

## 2022-01-03 ENCOUNTER — Ambulatory Visit: Payer: Medicare PPO | Admitting: Orthopaedic Surgery

## 2022-01-03 VITALS — BP 125/80 | HR 77 | Ht 65.0 in | Wt 197.8 lb

## 2022-01-03 DIAGNOSIS — L089 Local infection of the skin and subcutaneous tissue, unspecified: Secondary | ICD-10-CM

## 2022-01-03 NOTE — Progress Notes (Signed)
Subjective:    Patient ID: [name], female    DOB: Apr 01, 1956, 66 y.o.   MRN: [id number]  HPI She has developed an infection and swelling of the left second toe over the last five days.  She [place] weed [place] 12[place]30[place]21. She has neuropathy of the feet.  She noticed that evening her toe was red.  By Monday she had swelling, drainage and marked redness of the toe.  She saw Dr. Phillips Odor who gave her Rocephin and an antibiotic ointment.  She saw him yesterday and he asked she come here.  He has her also on oral amoxicillin preparation.   She is scheduled for a left total knee on 01-09-22 by Dr. Charlann Boxer in [place].  Dr. Phillips Odor tried to get the patient to go to Dr. Nilsa Nutting office yesterday but the patient said she was told they had no openings.  She denies being a diabetic.  Her A1C is 6.0.  She has no other wounds.    The toe is a little better in appearance today.  She took a picture of it late Sunday and I have reviewed it.     Review of Systems  Constitutional:  Positive for activity change.  Musculoskeletal:  Positive for arthralgias, gait problem, joint swelling and myalgias.  All other systems reviewed and are negative. For Review of Systems, all other systems reviewed and are negative.  The following is a summary of the past history medically, past history surgically, known current medicines, social history and family history.  This information is gathered electronically by the computer from prior information and documentation.  I review this each visit and have found including this information at this point in the chart is beneficial and informative.   Past Medical History:  Diagnosis Date   Arthritis    GERD (gastroesophageal reflux disease)    Hypertension    Neuropathy    Pneumonia    as a child   Pre-diabetes     Past Surgical History:  Procedure Laterality Date   ABDOMINAL HYSTERECTOMY     BIOPSY  03/13/2018   Procedure: BIOPSY;  Surgeon: Corbin Ade, MD;  Location: [facility];  Service: Endoscopy;;  (colon)   BLADDER SUSPENSION     COLONOSCOPY WITH PROPOFOL N/A 03/13/2018   Procedure: COLONOSCOPY WITH PROPOFOL;  Surgeon: Corbin Ade, MD;  Location: [facility];  Service: Endoscopy;  Laterality: N/A;  1:00pm-rescheduled to 11/21 @ 1:30pm per Mindy    Current Outpatient Medications on File Prior to Visit  Medication Sig Dispense Refill   amLODipine (NORVASC) 5 MG tablet Take 5 mg by mouth daily. Pt takes in the pm     amoxicillin-clavulanate (AUGMENTIN) 875-125 MG tablet Take 1 tablet by mouth in the morning and at bedtime.     atorvastatin (LIPITOR) 20 MG tablet Take 20 mg by mouth daily.     Calcium Citrate-Vitamin D (CITRACAL PETITES/VITAMIN D PO) Take 2 tablets by mouth 2 (two) times daily.      Cholecalciferol (VITAMIN D3) 250 MCG (10000 UT) capsule Take 10,000 Units by mouth daily.     cyanocobalamin (VITAMIN B12) 1000 MCG tablet Take 1,000 mcg by mouth daily.     folic acid (FOLVITE) 800 MCG tablet Take 800 mcg by mouth daily.      gabapentin (NEURONTIN) 300 MG capsule Take 300 mg by mouth 2 (two) times daily.  0   Ibuprofen-Acetaminophen (ADVIL DUAL ACTION) 125-250 MG TABS Take 2 tablets  by mouth daily as needed (pain).     Multiple Vitamin (MULTIVITAMIN) tablet Take 1 tablet by mouth daily.     mupirocin ointment (BACTROBAN) 2 % Apply 1 Application topically in the morning, at noon, and at bedtime.     olmesartan-hydrochlorothiazide (BENICAR HCT) 40-25 MG tablet Take 1 tablet by mouth daily. Pt takes in the am     Omega-3 Fatty Acids (FISH OIL) 1000 MG CAPS Take 1,000 mg by mouth daily.     No current facility-administered medications on file prior to visit.    Social History   Socioeconomic History   Marital status: Widowed    Spouse name: Not on file   Number of children: Not on file   Years of education: Not on file   Highest education level: Not on file  Occupational History   Not on file  Tobacco Use    Smoking status: Former   Smokeless tobacco: Never  Vaping Use   Vaping Use: Never used  Substance and Sexual Activity   Alcohol use: Never   Drug use: Never   Sexual activity: Not on file  Other Topics Concern   Not on file  Social History Narrative   Not on file   Social Determinants of Health   Financial Resource Strain: Not on file  Food Insecurity: Not on file  Transportation Needs: Not on file  Physical Activity: Not on file  Stress: Not on file  Social Connections: Not on file  Intimate Partner Violence: Not on file    No family history on file.  BP 125/80   Pulse 77   Ht 5\' 5"  (1.651 m)   Wt 197 lb 12.8 oz (89.7 kg)   BMI 32.92 kg/m   Body mass index is 32.92 kg/m.      Objective:   Physical Exam Vitals and nursing note reviewed. Exam conducted with a chaperone present.  Constitutional:      Appearance: She is well-developed.  HENT:     Head: Normocephalic and atraumatic.  Eyes:     Conjunctiva/sclera: Conjunctivae normal.     Pupils: Pupils are equal, round, and reactive to light.  Cardiovascular:     Rate and Rhythm: Normal rate and regular rhythm.  Pulmonary:     Effort: Pulmonary effort is normal.  Abdominal:     Palpations: Abdomen is soft.  Musculoskeletal:     Cervical back: Normal range of motion and neck supple.       Feet:  Skin:    General: Skin is warm and dry.  Neurological:     Mental Status: She is alert and oriented to person, place, and time.     Cranial Nerves: No cranial nerve deficit.     Motor: No abnormal muscle tone.     Coordination: Coordination normal.     Deep Tendon Reflexes: Reflexes are normal and symmetric. Reflexes normal.  Psychiatric:        Behavior: Behavior normal.        Thought Content: Thought content normal.        Judgment: Judgment normal.   X-rays were done of the left foot, reported separately. No acute fracture noted.        Assessment & Plan:   Encounter Diagnosis  Name Primary?    Left foot infection Yes   To call Dr. office for her to be seen there.  I will give samples of doxycycline 100 to begin, one daily.  She should see Dr. Nilsa Nutting first.  I have explained the seriousness of the infection.  Call if any problem.  Precautions discussed.  Electronically Signed Darreld Mclean, MD 9/13/20239:08 AM

## 2022-01-03 NOTE — Patient Instructions (Addendum)
Please call your surgeon that will be doing your knee replacement and let him know that you have an infection   **TAKE YOUR NEW ANTIBIOTIC**  DOXYCYCLINE: 1 TABLET BY MOUTH ONE TIME DAILY UNTIL FINISHED**  PATIENT MUST SEE DR.OWEN TODAY IN [place]. SHE IS SCHEDULED TO HAVE TKA NEXT WEEK

## 2022-01-04 DIAGNOSIS — M79672 Pain in left foot: Secondary | ICD-10-CM | POA: Diagnosis not present

## 2022-01-04 DIAGNOSIS — L03116 Cellulitis of left lower limb: Secondary | ICD-10-CM | POA: Diagnosis not present

## 2022-01-05 DIAGNOSIS — M79672 Pain in left foot: Secondary | ICD-10-CM | POA: Diagnosis not present

## 2022-01-09 ENCOUNTER — Ambulatory Visit (HOSPITAL_COMMUNITY): Admission: RE | Admit: 2022-01-09 | Payer: Medicare PPO | Source: Home / Self Care | Admitting: Orthopedic Surgery

## 2022-01-09 ENCOUNTER — Other Ambulatory Visit: Payer: Self-pay

## 2022-01-09 ENCOUNTER — Encounter (HOSPITAL_BASED_OUTPATIENT_CLINIC_OR_DEPARTMENT_OTHER): Payer: Self-pay | Admitting: Orthopaedic Surgery

## 2022-01-09 ENCOUNTER — Encounter (HOSPITAL_COMMUNITY): Admission: RE | Payer: Self-pay | Source: Home / Self Care

## 2022-01-09 DIAGNOSIS — M1712 Unilateral primary osteoarthritis, left knee: Secondary | ICD-10-CM

## 2022-01-09 DIAGNOSIS — L03116 Cellulitis of left lower limb: Secondary | ICD-10-CM | POA: Diagnosis not present

## 2022-01-09 SURGERY — ARTHROPLASTY, KNEE, TOTAL
Anesthesia: Spinal | Site: Knee | Laterality: Left

## 2022-01-10 ENCOUNTER — Other Ambulatory Visit (HOSPITAL_COMMUNITY): Payer: Self-pay | Admitting: Orthopedic Surgery

## 2022-01-10 NOTE — H&P (Signed)
ORTHOPAEDIC SURGERY H&P  Subjective:  The patient presents with left 2nd toe osteomyelitis and ulceration.   Past Medical History:  Diagnosis Date   Arthritis    GERD (gastroesophageal reflux disease)    Hypertension    Neuropathy    Osteomyelitis (HCC)    left 2nd toe   Pneumonia    as a child   Pre-diabetes     Past Surgical History:  Procedure Laterality Date   ABDOMINAL HYSTERECTOMY     BIOPSY  03/13/2018   Procedure: BIOPSY;  Surgeon: Corbin Ade, MD;  Location: [facility];  Service: Endoscopy;;  (colon)   BLADDER SUSPENSION     COLONOSCOPY WITH PROPOFOL N/A 03/13/2018   Procedure: COLONOSCOPY WITH PROPOFOL;  Surgeon: Corbin Ade, MD;  Location: [facility];  Service: Endoscopy;  Laterality: N/A;  1:00pm-rescheduled to 11/21 @ 1:30pm per Mindy     (Not in an outpatient encounter)    No Known Allergies  Social History   Socioeconomic History   Marital status: Widowed    Spouse name: Not on file   Number of children: Not on file   Years of education: Not on file   Highest education level: Not on file  Occupational History   Not on file  Tobacco Use   Smoking status: Former   Smokeless tobacco: Never  Vaping Use   Vaping Use: Never used  Substance and Sexual Activity   Alcohol use: Never   Drug use: Never   Sexual activity: Not Currently    Birth control/protection: Surgical    Comment: hyst  Other Topics Concern   Not on file  Social History Narrative   Not on file   Social Determinants of Health   Financial Resource Strain: Not on file  Food Insecurity: Not on file  Transportation Needs: Not on file  Physical Activity: Not on file  Stress: Not on file  Social Connections: Not on file  Intimate Partner Violence: Not on file     History reviewed. No pertinent family history.   Review of Systems Pertinent items are noted in HPI.  Objective: Vital signs in last 24 hours:    01/09/2022    4:40 PM 01/03/2022    8:21 AM  12/28/2021   11:27 AM  Vitals with BMI  Height 5\' 5"  5\' 5"  5\' 5"   Weight 197 lbs 197 lbs 13 oz 195 lbs  BMI 32.78 32.92 32.45  Systolic  125 123  Diastolic  80 80  Pulse  77 69      EXAM: General: Well nourished, well developed. Awake, alert and oriented to time, place, person. Normal mood and affect. No apparent distress. Breathing room air.  Operative Lower Extremity: Alignment - Neutral Deformity - left 2nd hammertoe Skin intact except sinus tract over left 2nd toe Tenderness to palpation - left 2nd toe Normal range of motion 5/5 TA, PT, GS, Per, EHL, FHL Sensation intact to light touch throughout Palpable DP and PT pulses Special testing: None  The contralateral foot/ankle was examined for comparison and noted to be neurovascularly intact with no localized deformity, swelling, or tenderness.  Imaging Review All images taken were independently reviewed by me.  Assessment/Plan: The clinical and radiographic findings were reviewed and discussed at length with the patient.  The patient has left 2nd toe osteomyelitis and ulceration.  We spoke at length about the natural course of these findings. We discussed nonoperative and operative treatment options in detail.  The risks and benefits were presented  and reviewed. The risks due to new/persistent infection, stiffness, nerve/vessel/tendon injury, wound healing issues, failure of this surgery, need for further surgery, thromboembolic events, further amputation, death among others were discussed. The patient acknowledged the explanation and agreed to proceed with the plan.  Armond Hang  Orthopaedic Surgery [facility]

## 2022-01-10 NOTE — Anesthesia Preprocedure Evaluation (Signed)
Anesthesia Evaluation  Patient identified by MRN, date of birth, ID band Patient awake    Reviewed: Allergy & Precautions, NPO status , Patient's Chart, lab work & pertinent test results  Airway Mallampati: II  TM Distance: >3 FB Neck ROM: Full    Dental  (+) Missing   Pulmonary former smoker,    Pulmonary exam normal        Cardiovascular hypertension, Pt. on medications Normal cardiovascular exam     Neuro/Psych Neuropathy negative psych ROS   GI/Hepatic negative GI ROS, Neg liver ROS,   Endo/Other  negative endocrine ROS  Renal/GU negative Renal ROS     Musculoskeletal  (+) Arthritis ,   Abdominal (+) + obese,   Peds  Hematology negative hematology ROS (+)   Anesthesia Other Findings Left foot cellulitis  Reproductive/Obstetrics                            Anesthesia Physical Anesthesia Plan  ASA: 2  Anesthesia Plan: MAC   Post-op Pain Management:    Induction: Intravenous  PONV Risk Score and Plan: 2 and Ondansetron, Dexamethasone, Propofol infusion, Midazolam and Treatment may vary due to age or medical condition  Airway Management Planned: Simple Face Mask  Additional Equipment:   Intra-op Plan:   Post-operative Plan:   Informed Consent: I have reviewed the patients History and Physical, chart, labs and discussed the procedure including the risks, benefits and alternatives for the proposed anesthesia with the patient or authorized representative who has indicated his/her understanding and acceptance.     Dental advisory given  Plan Discussed with: CRNA  Anesthesia Plan Comments:         Anesthesia Quick Evaluation

## 2022-01-10 NOTE — Discharge Instructions (Signed)
Armond Hang, MD [facility]  Please read the following information regarding your care after surgery.  Medications  You only need a prescription for the narcotic pain medicine (ex. oxycodone, Percocet, Norco).  All of the other medicines listed below are available over the counter. ? Aleve 2 pills twice a day for the first 3 days after surgery. ? acetominophen (Tylenol) 650 mg every 4-6 hours as you need for minor to moderate pain ? oxycodone as prescribed for severe pain  ? To help prevent blood clots, take aspirin (81 mg) twice daily for 28 days after surgery.  You should also get up every hour while you are awake to move around.  Weight Bearing ? Do NOT bear any weight on the operated toe. However, you may HEEL weightbear in the postop shoe. This means do NOT touch the front part of your foot to the ground!  Cast / Splint / Dressing ? If you have a splint, do NOT remove this. Keep your splint, cast or dressing clean and dry.  Don't put anything (coat hanger, pencil, etc) down inside of it.  If it gets wet, call the office immediately to schedule an appointment for a cast change.  Swelling IMPORTANT: It is normal for you to have swelling where you had surgery. To reduce swelling and pain, keep at least 3 pillows under your leg so that your toes are above your nose and your heel is above the level of your hip.  It may be necessary to keep your foot or leg elevated for several weeks.  This is critical to helping your incisions heal and your pain to feel better.  Follow Up Call my office at [phone] when you are discharged from the hospital or surgery center to schedule an appointment to be seen 7-10 days after surgery.  Call my office at [phone] if you develop a fever >101.5 F, nausea, vomiting, bleeding from the surgical site or severe pain.

## 2022-01-11 ENCOUNTER — Encounter (HOSPITAL_BASED_OUTPATIENT_CLINIC_OR_DEPARTMENT_OTHER)
Admission: RE | Disposition: A | Discharge status: Home / Self Care | Payer: Self-pay | Source: Home / Self Care | Attending: Orthopaedic Surgery

## 2022-01-11 ENCOUNTER — Telehealth: Payer: Self-pay

## 2022-01-11 ENCOUNTER — Other Ambulatory Visit: Payer: Self-pay

## 2022-01-11 ENCOUNTER — Ambulatory Visit (HOSPITAL_BASED_OUTPATIENT_CLINIC_OR_DEPARTMENT_OTHER): Payer: Medicare PPO | Admitting: Anesthesiology

## 2022-01-11 ENCOUNTER — Ambulatory Visit (HOSPITAL_BASED_OUTPATIENT_CLINIC_OR_DEPARTMENT_OTHER): Payer: Medicare PPO

## 2022-01-11 ENCOUNTER — Encounter (HOSPITAL_BASED_OUTPATIENT_CLINIC_OR_DEPARTMENT_OTHER): Payer: Self-pay | Admitting: Orthopaedic Surgery

## 2022-01-11 ENCOUNTER — Ambulatory Visit (HOSPITAL_BASED_OUTPATIENT_CLINIC_OR_DEPARTMENT_OTHER)
Admission: RE | Admit: 2022-01-11 | Discharge: 2022-01-11 | Disposition: A | Discharge status: Home / Self Care | Payer: Medicare PPO | Attending: Orthopaedic Surgery | Admitting: Orthopaedic Surgery

## 2022-01-11 DIAGNOSIS — Z87891 Personal history of nicotine dependence: Secondary | ICD-10-CM | POA: Insufficient documentation

## 2022-01-11 DIAGNOSIS — M86172 Other acute osteomyelitis, left ankle and foot: Secondary | ICD-10-CM

## 2022-01-11 DIAGNOSIS — W230XXA Caught, crushed, jammed, or pinched between moving objects, initial encounter: Secondary | ICD-10-CM | POA: Insufficient documentation

## 2022-01-11 DIAGNOSIS — R7303 Prediabetes: Secondary | ICD-10-CM | POA: Diagnosis not present

## 2022-01-11 DIAGNOSIS — S97112A Crushing injury of left great toe, initial encounter: Secondary | ICD-10-CM

## 2022-01-11 DIAGNOSIS — I10 Essential (primary) hypertension: Secondary | ICD-10-CM

## 2022-01-11 DIAGNOSIS — S97122A Crushing injury of left lesser toe(s), initial encounter: Secondary | ICD-10-CM | POA: Diagnosis not present

## 2022-01-11 DIAGNOSIS — E669 Obesity, unspecified: Secondary | ICD-10-CM | POA: Diagnosis not present

## 2022-01-11 DIAGNOSIS — Z01818 Encounter for other preprocedural examination: Secondary | ICD-10-CM

## 2022-01-11 HISTORY — PX: AMPUTATION TOE: SHX6595

## 2022-01-11 HISTORY — DX: Osteomyelitis, unspecified: M86.9

## 2022-01-11 SURGERY — AMPUTATION, TOE
Anesthesia: Monitor Anesthesia Care | Site: Toe | Laterality: Left

## 2022-01-11 MED ORDER — PROPOFOL 500 MG/50ML IV EMUL
INTRAVENOUS | Status: AC
  Filled 2022-01-11: qty 50

## 2022-01-11 MED ORDER — VANCOMYCIN HCL 500 MG IV SOLR
INTRAVENOUS | Status: AC
  Filled 2022-01-11: qty 10

## 2022-01-11 MED ORDER — ONDANSETRON HCL 4 MG/2ML IJ SOLN
INTRAMUSCULAR | Status: AC
  Filled 2022-01-11: qty 2

## 2022-01-11 MED ORDER — KETOROLAC TROMETHAMINE 15 MG/ML IJ SOLN: 15.0000 mg | Freq: Once | INTRAMUSCULAR | Status: DC | PRN

## 2022-01-11 MED ORDER — ONDANSETRON HCL 4 MG/2ML IJ SOLN: 4.0000 mg | Freq: Once | INTRAMUSCULAR | Status: DC | PRN

## 2022-01-11 MED ORDER — LIDOCAINE 2% (20 MG/ML) 5 ML SYRINGE
INTRAMUSCULAR | Status: AC
  Filled 2022-01-11: qty 5

## 2022-01-11 MED ORDER — BUPIVACAINE-EPINEPHRINE (PF) 0.25% -1:200000 IJ SOLN
INTRAMUSCULAR | Status: AC
  Filled 2022-01-11: qty 30

## 2022-01-11 MED ORDER — ACETAMINOPHEN 500 MG PO TABS
ORAL_TABLET | ORAL | Status: AC
  Filled 2022-01-11: qty 2

## 2022-01-11 MED ORDER — BUPIVACAINE-EPINEPHRINE (PF) 0.5% -1:200000 IJ SOLN
INTRAMUSCULAR | Status: AC
  Filled 2022-01-11: qty 30

## 2022-01-11 MED ORDER — PROPOFOL 500 MG/50ML IV EMUL
INTRAVENOUS | Status: DC | PRN
  Administered 2022-01-11: 50 ug/kg/min via INTRAVENOUS

## 2022-01-11 MED ORDER — FENTANYL CITRATE (PF) 100 MCG/2ML IJ SOLN
INTRAMUSCULAR | Status: AC
  Filled 2022-01-11: qty 2

## 2022-01-11 MED ORDER — VANCOMYCIN HCL 500 MG IV SOLR
INTRAVENOUS | Status: DC | PRN
  Administered 2022-01-11: 500 mg via TOPICAL

## 2022-01-11 MED ORDER — AMISULPRIDE (ANTIEMETIC) 5 MG/2ML IV SOLN: 10.0000 mg | Freq: Once | INTRAVENOUS | Status: DC | PRN

## 2022-01-11 MED ORDER — BUPIVACAINE HCL (PF) 0.25 % IJ SOLN
INTRAMUSCULAR | Status: AC
  Filled 2022-01-11: qty 90

## 2022-01-11 MED ORDER — 0.9 % SODIUM CHLORIDE (POUR BTL) OPTIME
TOPICAL | Status: DC | PRN
  Administered 2022-01-11: 1800 mL

## 2022-01-11 MED ORDER — FENTANYL CITRATE (PF) 100 MCG/2ML IJ SOLN: 25.0000 ug | INTRAMUSCULAR | Status: DC | PRN

## 2022-01-11 MED ORDER — CEFAZOLIN SODIUM-DEXTROSE 2-4 GM/100ML-% IV SOLN
2.0000 g | INTRAVENOUS | Status: AC
  Administered 2022-01-11: 2 g via INTRAVENOUS

## 2022-01-11 MED ORDER — BUPIVACAINE HCL (PF) 0.25 % IJ SOLN
INTRAMUSCULAR | Status: DC | PRN
  Administered 2022-01-11: 10 mL

## 2022-01-11 MED ORDER — ACETAMINOPHEN 500 MG PO TABS
1000.0000 mg | ORAL_TABLET | Freq: Once | ORAL | Status: AC
  Administered 2022-01-11: 1000 mg via ORAL

## 2022-01-11 MED ORDER — SODIUM CHLORIDE 0.9 % IV SOLN: INTRAVENOUS | Status: DC

## 2022-01-11 MED ORDER — LACTATED RINGERS IV SOLN: INTRAVENOUS | Status: DC

## 2022-01-11 MED ORDER — CEFAZOLIN SODIUM-DEXTROSE 2-4 GM/100ML-% IV SOLN
INTRAVENOUS | Status: AC
  Filled 2022-01-11: qty 100

## 2022-01-11 MED ORDER — DEXAMETHASONE SODIUM PHOSPHATE 10 MG/ML IJ SOLN
INTRAMUSCULAR | Status: AC
  Filled 2022-01-11: qty 1

## 2022-01-11 MED ORDER — ONDANSETRON HCL 4 MG/2ML IJ SOLN
INTRAMUSCULAR | Status: DC | PRN
  Administered 2022-01-11: 4 mg via INTRAVENOUS

## 2022-01-11 MED ORDER — FENTANYL CITRATE (PF) 100 MCG/2ML IJ SOLN
INTRAMUSCULAR | Status: DC | PRN
  Administered 2022-01-11 (×2): 50 ug via INTRAVENOUS

## 2022-01-11 MED ORDER — MIDAZOLAM HCL 5 MG/5ML IJ SOLN
INTRAMUSCULAR | Status: DC | PRN
  Administered 2022-01-11: 2 mg via INTRAVENOUS

## 2022-01-11 MED ORDER — MIDAZOLAM HCL 2 MG/2ML IJ SOLN
INTRAMUSCULAR | Status: AC
  Filled 2022-01-11: qty 2

## 2022-01-11 SURGICAL SUPPLY — 73 items
APL PRP STRL LF DISP 70% ISPRP (MISCELLANEOUS) ×2
BANDAGE ESMARK 6X9 LF (GAUZE/BANDAGES/DRESSINGS) ×1 IMPLANT
BLADE AVERAGE 25X9 (BLADE) IMPLANT
BLADE SURG 15 STRL LF DISP TIS (BLADE) ×4 IMPLANT
BLADE SURG 15 STRL SS (BLADE) ×4
BNDG CMPR 5X4 CHSV STRCH STRL (GAUZE/BANDAGES/DRESSINGS) ×1
BNDG CMPR 9X6 STRL LF SNTH (GAUZE/BANDAGES/DRESSINGS) ×1
BNDG COHESIVE 4X5 TAN STRL LF (GAUZE/BANDAGES/DRESSINGS) ×1 IMPLANT
BNDG ELASTIC 4X5.8 VLCR STR LF (GAUZE/BANDAGES/DRESSINGS) ×1 IMPLANT
BNDG ESMARK 6X9 LF (GAUZE/BANDAGES/DRESSINGS) ×1
BNDG GAUZE DERMACEA FLUFF 4 (GAUZE/BANDAGES/DRESSINGS) ×1 IMPLANT
BNDG GZE DERMACEA 4 6PLY (GAUZE/BANDAGES/DRESSINGS) ×1
BRUSH SCRUB EZ  4% CHG (MISCELLANEOUS) ×1
BRUSH SCRUB EZ 4% CHG (MISCELLANEOUS) ×1 IMPLANT
CANISTER SUCT 1200ML W/VALVE (MISCELLANEOUS) ×1 IMPLANT
CHLORAPREP W/TINT 26 (MISCELLANEOUS) ×2 IMPLANT
CNTNR URN SCR LID CUP LEK RST (MISCELLANEOUS) IMPLANT
CONT SPEC 4OZ STRL OR WHT (MISCELLANEOUS) ×1
COVER BACK TABLE 60X90IN (DRAPES) ×1 IMPLANT
CUFF TOURN SGL QUICK 34 (TOURNIQUET CUFF)
CUFF TRNQT CYL 34X4.125X (TOURNIQUET CUFF) IMPLANT
DRAPE EXTREMITY T 121X128X90 (DISPOSABLE) ×1 IMPLANT
DRAPE IMP U-DRAPE 54X76 (DRAPES) ×1 IMPLANT
DRAPE OEC MINIVIEW 54X84 (DRAPES) IMPLANT
DRAPE U-SHAPE 47X51 STRL (DRAPES) ×1 IMPLANT
DRSG EMULSION OIL 3X3 NADH (GAUZE/BANDAGES/DRESSINGS) IMPLANT
ELECT REM PT RETURN 9FT ADLT (ELECTROSURGICAL) ×1
ELECTRODE REM PT RTRN 9FT ADLT (ELECTROSURGICAL) ×1 IMPLANT
GAUZE PAD ABD 8X10 STRL (GAUZE/BANDAGES/DRESSINGS) ×1 IMPLANT
GAUZE SPONGE 4X4 12PLY STRL (GAUZE/BANDAGES/DRESSINGS) ×1 IMPLANT
GAUZE XEROFORM 1X8 LF (GAUZE/BANDAGES/DRESSINGS) ×1 IMPLANT
GLOVE BIO SURGEON STRL SZ7.5 (GLOVE) IMPLANT
GLOVE BIOGEL M STRL SZ7.5 (GLOVE) ×2 IMPLANT
GLOVE BIOGEL PI IND STRL 7.0 (GLOVE) IMPLANT
GLOVE BIOGEL PI IND STRL 8 (GLOVE) ×1 IMPLANT
GOWN STRL REUS W/ TWL LRG LVL3 (GOWN DISPOSABLE) ×2 IMPLANT
GOWN STRL REUS W/TWL LRG LVL3 (GOWN DISPOSABLE) ×2
MARKER SKIN DUAL TIP RULER LAB (MISCELLANEOUS) IMPLANT
NEEDLE HYPO 22GX1.5 SAFETY (NEEDLE) IMPLANT
NS IRRIG 1000ML POUR BTL (IV SOLUTION) ×1 IMPLANT
PACK BASIN DAY SURGERY FS (CUSTOM PROCEDURE TRAY) ×1 IMPLANT
PAD CAST 4YDX4 CTTN HI CHSV (CAST SUPPLIES) ×1 IMPLANT
PADDING CAST ABS COTTON 4X4 ST (CAST SUPPLIES) IMPLANT
PADDING CAST COTTON 4X4 STRL (CAST SUPPLIES) ×1
PADDING CAST SYNTHETIC 4X4 STR (CAST SUPPLIES) ×4 IMPLANT
PENCIL SMOKE EVACUATOR (MISCELLANEOUS) ×1 IMPLANT
SANITIZER HAND PURELL 535ML FO (MISCELLANEOUS) ×1 IMPLANT
SET IRRIG Y TYPE TUR BLADDER L (SET/KITS/TRAYS/PACK) IMPLANT
SHEET MEDIUM DRAPE 40X70 STRL (DRAPES) ×1 IMPLANT
SLEEVE SCD COMPRESS KNEE MED (STOCKING) ×1 IMPLANT
SPIKE FLUID TRANSFER (MISCELLANEOUS) IMPLANT
SPONGE T-LAP 18X18 ~~LOC~~+RFID (SPONGE) ×1 IMPLANT
STOCKINETTE 6  STRL (DRAPES) ×1
STOCKINETTE 6 STRL (DRAPES) ×1 IMPLANT
STOCKINETTE ORTHO 6X25 (MISCELLANEOUS) ×1 IMPLANT
SUCTION FRAZIER HANDLE 10FR (MISCELLANEOUS) ×1
SUCTION TUBE FRAZIER 10FR DISP (MISCELLANEOUS) IMPLANT
SUT ETHILON 2 0 FS 18 (SUTURE) ×2 IMPLANT
SUT ETHILON 3 0 PS 1 (SUTURE) IMPLANT
SUT MNCRL AB 3-0 PS2 18 (SUTURE) ×1 IMPLANT
SUT PROLENE 3 0 PS 2 (SUTURE) IMPLANT
SUT VIC AB 2-0 SH 27 (SUTURE) ×1
SUT VIC AB 2-0 SH 27XBRD (SUTURE) ×1 IMPLANT
SUT VIC AB 3-0 SH 27 (SUTURE)
SUT VIC AB 3-0 SH 27X BRD (SUTURE) IMPLANT
SUT VICRYL 0 SH 27 (SUTURE) IMPLANT
SWAB COLLECTION DEVICE MRSA (MISCELLANEOUS) IMPLANT
SWAB CULTURE ESWAB REG 1ML (MISCELLANEOUS) IMPLANT
SYR BULB EAR ULCER 3OZ GRN STR (SYRINGE) ×1 IMPLANT
SYR CONTROL 10ML LL (SYRINGE) IMPLANT
TOWEL GREEN STERILE FF (TOWEL DISPOSABLE) ×2 IMPLANT
TUBE CONNECTING 20X1/4 (TUBING) ×1 IMPLANT
UNDERPAD 30X36 HEAVY ABSORB (UNDERPADS AND DIAPERS) ×1 IMPLANT

## 2022-01-11 NOTE — Anesthesia Postprocedure Evaluation (Signed)
Anesthesia Post Note  Patient: [name]  Procedure(s) Performed: Left 2nd toe partial amputation (Left: Toe)     Patient location during evaluation: PACU Anesthesia Type: MAC Level of consciousness: awake Pain management: pain level controlled Vital Signs Assessment: post-procedure vital signs reviewed and stable Respiratory status: spontaneous breathing, nonlabored ventilation, respiratory function stable and patient connected to nasal cannula oxygen Cardiovascular status: stable and blood pressure returned to baseline Postop Assessment: no apparent nausea or vomiting Anesthetic complications: no   No notable events documented.  Last Vitals:  Vitals:   01/11/22 0945 01/11/22 1004  BP: 111/63 (!) 121/58  Pulse: (!) 57 65  Resp: 12 18  Temp:  36.4 C  SpO2: 93% 99%    Last Pain:  Vitals:   01/11/22 1004  TempSrc: Oral  PainSc: 0-No pain                 Selim Durden P Elly Haffey

## 2022-01-11 NOTE — Op Note (Signed)
01/11/2022  9:06 AM   PATIENT: [name]  66 y.o. female  MRN: [id number]   PRE-OPERATIVE DIAGNOSIS:   Left second toe crush injury with acute osteomyelitis of the distal phalanx and draining sinus tract   POST-OPERATIVE DIAGNOSIS:   Same   PROCEDURE: Left foot 2nd toe partial amputation    SURGEON:  Armond Hang, MD   ASSISTANT: None   ANESTHESIA: General, regional   EBL: Minimal   TOURNIQUET: None used   COMPLICATIONS: None apparent   DISPOSITION: Extubated, awake and stable to recovery.   INDICATION FOR PROCEDURE: The patient presented with left foot 2nd toe crush injury and subsequent persistent drainage following yardwork injury on 12/28/21. She is prediabetic (HgbA1c 5.6) but otherwise healthy. She is a retired Dietitian. Of note, she had a history of left 2nd toe redness 1 year ago which resolved on its own per patient report. She denies fever/chills. She underwent MRI which confirmed sinus tract and acute osteomyelitis of the distal phalanx of that toe. She was supposed to undergo ipsilateral left TKA this year but her surgeon has deferred this due to suspected ongoing left foot infection.  We discussed the diagnosis, alternative treatment options, risks and benefits of the above surgical intervention, as well as alternative non-operative treatments. All questions/concerns were addressed and the patient/family demonstrated appropriate understanding of the diagnosis, the procedure, the postoperative course, and overall prognosis. The patient wished to proceed with surgical intervention and signed an informed surgical consent as such, in each others presence prior to surgery.   PROCEDURE IN DETAIL: After preoperative consent was obtained and the correct operative site was identified, the patient was brought to the operating room supine on stretcher and transferred onto operating table. General anesthesia was induced. Preoperative antibiotics were  administered. Surgical timeout was taken. The patient was then positioned supine with an ipsilateral hip bump. The operative lower extremity was prepped and draped in standard sterile fashion. No tourniquet was utilized. A metatarsal block was performed using plain 0.25% bupivacaine.   A standard fishmouth incision was made over the middle phalanx of the left second toe. This was positioned to avoid areas of soft tissue necrosis. Dissection was carried down to the level of bone circumferentially. A saw blade was used to perform amputation through the middle phalanx to preserve as much of the toe length as permitted by soft tissues and as dictated by the MRI findings. The resected left second toe was sent for tissue cultures. Aerobic and anaerobic swabs were also taken. The amputation stump was copiously irrigated with several liters of fluid. Betadine solution was instilled in the wound and vancomycin powder applied.  Hemostasis achieved and healthy pink tissues with normal bleeding observed in the stump. The skin was closed without tension using 3-0 prolene suture.    The leg was cleaned with saline and sterile adaptic dressings with gauze were applied. A well padded bulky wrap was loosely applied. The patient was awakened from anesthesia and transported to the recovery room in stable condition.    FOLLOW UP PLAN: -transfer to PACU, then home -reviewed case with Infectious Diseases on call - advised to start patient on Doxycycline 100 mg BID x 2 wk and Augmentin 875-125 mg BID x 2 wk. ID team will arrange outpatient follow up thru their referral coordinator -trend intraop cultures to guide antibiotic therapy -strict heel weightbearing operative extremity in postop shoe, maximum elevation -maintain dressings until follow up -DVT ppx: Aspirin 81 mg twice daily until full weightbearing -  follow up as outpatient in 7 days for wound check -sutures out in 2-3 weeks in outpatient  office   RADIOGRAPHS: AP, lateral, oblique radiographs of the left foot were obtained intraoperatively. These showed interval partial amputation of the left 2nd toe. No acute injuries are noted.   Armond Hang Orthopaedic Surgery [facility]

## 2022-01-11 NOTE — Transfer of Care (Signed)
Immediate Anesthesia Transfer of Care Note  Patient: [name]  Procedure(s) Performed: Left 2nd toe partial amputation (Left: Toe)  Patient Location: PACU  Anesthesia Type:MAC  Level of Consciousness: awake  Airway & Oxygen Therapy: Patient Spontanous Breathing and Patient connected to face mask oxygen  Post-op Assessment: Report given to RN and Post -op Vital signs reviewed and stable  Post vital signs: Reviewed and stable  Last Vitals:  Vitals Value Taken Time  BP    Temp    Pulse 62 01/11/22 0853  Resp    SpO2 95 % 01/11/22 0853  Vitals shown include unvalidated device data.  Last Pain:  Vitals:   01/11/22 0637  TempSrc: Oral  PainSc: 0-No pain      Patients Stated Pain Goal: 4 (41/93/79 0240)  Complications: No notable events documented.

## 2022-01-11 NOTE — H&P (Signed)
H&P Update: ° °-History and Physical Reviewed ° °-Patient has been re-examined ° °-No change in the plan of care ° °-The risks and benefits were presented and reviewed. The risks due to new/persistent infection, stiffness, nerve/vessel/tendon injury, nonunion/malunion, wound healing issues, development of arthritis, failure of this surgery, possibility of delayed definitive surgery, need for further surgery, thromboembolic events, anesthesia/medical complications, further amputation, death among others were discussed. The patient acknowledged the explanation, agreed to proceed with the plan and a consent was signed. ° °[name] ° °

## 2022-01-11 NOTE — Telephone Encounter (Signed)
Two weeks was what you previously requested. Appt scheduled with you for 1 week out from today. Let me know if this is sufficient.Again appt will print on discharge summary and Mychart notification with details.  Thanks

## 2022-01-11 NOTE — Telephone Encounter (Signed)
-----   Message from Rosiland Oz, MD sent at 01/11/2022  9:11 AM EDT ----- Regarding: New pateint appt Hi team  Please arrange for new patient appt for foot osteo s/p I and D by Ortho next week, getting discharged after OP surgery today with doxy and augmentin for 2 weeks pending cultures.   Thanks

## 2022-01-11 NOTE — Telephone Encounter (Signed)
Staff received in-basket message from provider to scheduled hospital follow up visit in 2 weeks for foot osteo. Patient scheduled and appointment will print out on discharge patient work. Patient also has Mychart and will receive appointment details as well.   Eugenia Mcalpine, LPN

## 2022-01-12 ENCOUNTER — Encounter (HOSPITAL_BASED_OUTPATIENT_CLINIC_OR_DEPARTMENT_OTHER): Payer: Self-pay | Admitting: Orthopaedic Surgery

## 2022-01-13 ENCOUNTER — Other Ambulatory Visit: Payer: Self-pay | Admitting: Infectious Diseases

## 2022-01-13 MED ORDER — CIPROFLOXACIN HCL 500 MG PO TABS: 500.0000 mg | ORAL_TABLET | Freq: Two times a day (BID) | ORAL | 0 refills | Status: DC

## 2022-01-13 NOTE — Progress Notes (Signed)
Called patient at the listed phone number [phone], did  not pickup and call back number left regarding new update of cultures and need to change antibiotics to ciprofloxacin

## 2022-01-14 ENCOUNTER — Encounter: Payer: Self-pay | Admitting: Infectious Diseases

## 2022-01-14 NOTE — Progress Notes (Signed)
0 Result Notes    Component 3 d ago  Specimen Description TOE   Special Requests  LEFT SECOND TOE PARTIAL   Gram Stain RARE WBC PRESENT, PREDOMINANTLY PMN  RARE GRAM POSITIVE RODS  Performed at [facility], [place] [address], [place], [place] [address]   Culture RARE ENTEROBACTER CANCEROGENUS  CULTURE REINCUBATED FOR BETTER GROWTH  NO ANAEROBES ISOLATED; CULTURE IN PROGRESS FOR 5 DAYS   Report Status PENDING   Organism ID, Bacteria ENTEROBACTER CANCEROGENUS   Resulting Agency [facility]     Susceptibility   Enterobacter cancerogenus    MIC    CEFAZOLIN >=64 RESIST... Resistant    CEFEPIME 0.5 SENSITIVE  Sensitive    CEFTAZIDIME >=64 RESIST... Resistant    CEFTRIAXONE >=64 RESIST... Resistant    CIPROFLOXACIN <=0.25 SENS... Sensitive    GENTAMICIN <=1 SENSITIVE  Sensitive    IMIPENEM <=0.25 SENS... Sensitive    PIP/TAZO >=128 RESIS... Resistant    TRIMETH/SULFA <=20 SENSIT... Sensitive           Susceptibility Comments  Enterobacter cancerogenus  RARE ENTEROBACTER CANCEROGENUS         Spoke with patient and advised to stop augmentin and start taking ciprofloxacin as ordered and continue taking doxycyline pending final cultures. Patient will fu in the clinic.

## 2022-01-16 LAB — AEROBIC/ANAEROBIC CULTURE W GRAM STAIN (SURGICAL/DEEP WOUND): Gram Stain: NONE SEEN

## 2022-01-18 ENCOUNTER — Ambulatory Visit: Payer: Medicare PPO | Admitting: Infectious Diseases

## 2022-01-18 ENCOUNTER — Other Ambulatory Visit: Payer: Self-pay

## 2022-01-18 VITALS — BP 134/81 | HR 62 | Temp 97.7°F | Wt 200.0 lb

## 2022-01-18 DIAGNOSIS — G629 Polyneuropathy, unspecified: Secondary | ICD-10-CM | POA: Diagnosis not present

## 2022-01-18 DIAGNOSIS — Z5181 Encounter for therapeutic drug level monitoring: Secondary | ICD-10-CM | POA: Diagnosis not present

## 2022-01-18 DIAGNOSIS — R7303 Prediabetes: Secondary | ICD-10-CM | POA: Diagnosis not present

## 2022-01-18 DIAGNOSIS — M86172 Other acute osteomyelitis, left ankle and foot: Secondary | ICD-10-CM | POA: Diagnosis not present

## 2022-01-18 MED ORDER — CIPROFLOXACIN HCL 500 MG PO TABS: 500.0000 mg | ORAL_TABLET | Freq: Two times a day (BID) | ORAL | 0 refills | Status: DC

## 2022-01-18 MED ORDER — DOXYCYCLINE HYCLATE 100 MG PO TABS: 100.0000 mg | ORAL_TABLET | Freq: Two times a day (BID) | ORAL | 0 refills | Status: DC

## 2022-01-18 NOTE — Progress Notes (Addendum)
Patient Active Problem List   Diagnosis Date Noted   Change in bowel habits 07/09/2018   History of colonic polyps 07/09/2018    Patient's Medications  New Prescriptions   No medications on file  Previous Medications   AMLODIPINE (NORVASC) 5 MG TABLET    Take 5 mg by mouth daily. Pt takes in the pm   ATORVASTATIN (LIPITOR) 20 MG TABLET    Take 20 mg by mouth daily.   CALCIUM CITRATE-VITAMIN D (CITRACAL PETITES/VITAMIN D PO)    Take 2 tablets by mouth 2 (two) times daily.    CEPHALEXIN (KEFLEX) 500 MG CAPSULE    Take 500 mg by mouth 4 (four) times daily.   CHOLECALCIFEROL (VITAMIN D3) 250 MCG (10000 UT) CAPSULE    Take 10,000 Units by mouth daily.   CIPROFLOXACIN (CIPRO) 500 MG TABLET    Take 1 tablet (500 mg total) by mouth 2 (two) times daily for 14 days.   CYANOCOBALAMIN (VITAMIN B12) 1000 MCG TABLET    Take 1,000 mcg by mouth daily.   FOLIC ACID (FOLVITE) 800 MCG TABLET    Take 800 mcg by mouth daily.    GABAPENTIN (NEURONTIN) 300 MG CAPSULE    Take 300 mg by mouth 2 (two) times daily.   IBUPROFEN-ACETAMINOPHEN (ADVIL DUAL ACTION) 125-250 MG TABS    Take 2 tablets by mouth daily as needed (pain).   MULTIPLE VITAMIN (MULTIVITAMIN) TABLET    Take 1 tablet by mouth daily.   OLMESARTAN-HYDROCHLOROTHIAZIDE (BENICAR HCT) 40-25 MG TABLET    Take 1 tablet by mouth daily. Pt takes in the am   OMEGA-3 FATTY ACIDS (FISH OIL) 1000 MG CAPS    Take 1,000 mg by mouth daily.  Modified Medications   No medications on file  Discontinued Medications   No medications on file    Subjective: 66 Y O Female with PMH of GERD, Arthritis, HTN, Prediabetes, Neuropathy who is referred from Dr Odis Hollingshead for concerns of left second toe OM. Patient was discharged on 9/21 after an elective OP OR on 9/21 with 2 weeks course of PO doxycycline and augmentin pending OR cultures   She was working in her garden and was doing some weed eating on 9/7. She had some tightness and swelling in her left foot  the following day. She does not remember any specific trauma.injury or insect bite. She soaked her foot in the epsom salt and following day soaked with some alcohol. She remembers having callus removed from the tip of her left second toe back in August 2023 and it was uneventful with no concerns afterwards. She was seen by her PCP who gave her 1 shot of ceftriaxone daily for 2 days and also a course of augmentin + mupirocin ointment. Seen by Dr Cherylann Banas on 9/13 and advised to fu with Dr Charlann Boxer as well as a course of doxycycline. 9/13 left foot xray unremarkable. She was supposed to get Left total knee replaced on 9/19 with Dr Charlann Boxer. Patient was seen by Dr Charlann Boxer 9/14, was given 2 weeks course of cephalexin. MRI was ordered ( cannot view results) and deemed to have left second toe  distal osteomyelitis with presence of sinus tract and underwent Left foot 2nd toe partial amputation 9/21. OR cultures polymicrobial as below.   She has prediabetes and neuropathy but no known vascular disease of lower extremity.  Denies any fevers, chills and night sweats. Denies nausea, vomiting, abdominal pain and diarrhea. Denies chest  pain, cough and SOB. Denies GU symptoms and rashes. Denies headache, blurry vision, weakness, numbness and tingling. Quit smoking 2008, denies alcohol and IVDU. Her 2 sons live with her.  Review of Systems: all systems reviewed with pertinent positives and negatives as listed above   Past Medical History:  Diagnosis Date   Arthritis    GERD (gastroesophageal reflux disease)    Hypertension    Neuropathy    Osteomyelitis (HCC)    left 2nd toe   Pneumonia    as a child   Pre-diabetes    Past Surgical History:  Procedure Laterality Date   ABDOMINAL HYSTERECTOMY     AMPUTATION TOE Left 01/11/2022   Procedure: Left 2nd toe partial amputation;  Surgeon: Netta Cedars, MD;  Location: [facility];  Service: Orthopedics;  Laterality: Left;  60 min   BIOPSY  03/13/2018    Procedure: BIOPSY;  Surgeon: Corbin Ade, MD;  [facility];  Service: Endoscopy;;  (colon)   BLADDER SUSPENSION     COLONOSCOPY WITH PROPOFOL N/A 03/13/2018   Procedure: COLONOSCOPY WITH PROPOFOL;  Surgeon: Corbin Ade, MD;  Location: [facility];  Service: Endoscopy;  Laterality: N/A;  1:00pm-rescheduled to 11/21 @ 1:30pm per Mindy     Social History   Tobacco Use   Smoking status: Former   Smokeless tobacco: Never  Building services engineer Use: Never used  Substance Use Topics   Alcohol use: Never   Drug use: Never    No family history on file.  No Known Allergies  Health Maintenance  Topic Date Due   COVID-19 Vaccine (1) Never done   Hepatitis C Screening  Never done   TETANUS/TDAP  Never done   Zoster Vaccines- Shingrix (1 of 2) Never done   Pneumonia Vaccine 53+ Years old (1 - PCV) Never done   INFLUENZA VACCINE  Never done   MAMMOGRAM  05/05/2023   COLONOSCOPY (Pts 45-80yrs Insurance coverage will need to be confirmed)  03/13/2028   DEXA SCAN  Completed   HPV VACCINES  Aged Out    Objective:  Vitals:   01/18/22 1058  BP: 134/81  Pulse: 62  Temp: 97.7 F (36.5 C)  TempSrc: Oral  SpO2: 97%  Weight: 200 lb (90.7 kg)   Body mass index is 33.28 kg/m.  Physical Exam Constitutional:      Appearance: Normal appearance.  HENT:     Head: Normocephalic and atraumatic.      Mouth: Mucous membranes are moist.  Eyes:    Conjunctiva/sclera: Conjunctivae normal.     Pupils:   Cardiovascular:     Rate and Rhythm: Normal rate and regular rhythm.     Heart sounds:   Pulmonary:     Effort: Pulmonary effort is normal.     Breath sounds: Normal breath sounds.   Abdominal:     General: Non distended     Palpations: soft.   Musculoskeletal:        General: Normal range of motion.   Left second toe - amputated site with mild redness with no swelling, drainage or tenderness. Left DP 2+. ROM of left 2nd toe, left ankle intact. Left foot  unremarkable     Skin:    General: Skin is warm and dry.     Comments:  Neurological:     General: grossly non focal     Mental Status: awake, alert and oriented to person, place, and time.   Psychiatric:  Mood and Affect: Mood normal.   Lab Results Lab Results  Component Value Date   WBC 10.4 12/28/2021   HGB 13.5 12/28/2021   HCT 42.3 12/28/2021   MCV 95.3 12/28/2021   PLT 224 12/28/2021    Lab Results  Component Value Date   CREATININE 1.00 12/28/2021   BUN 31 (H) 12/28/2021   NA 140 12/28/2021   K 3.3 (L) 12/28/2021   CL 103 12/28/2021   CO2 28 12/28/2021    Lab Results  Component Value Date   ALT 37 (H) 03/11/2018   AST 33 03/11/2018   ALKPHOS 53 03/11/2018   BILITOT 0.4 03/11/2018    No results found for: "CHOL", "HDL", "LDLCALC", "LDLDIRECT", "TRIG", "CHOLHDL" No results found for: "LABRPR", "RPRTITER" No results found for: "HIV1RNAQUANT", "HIV1RNAVL", "CD4TABS"   Microbiology Results for orders placed or performed during the hospital encounter of 01/11/22  Aerobic/Anaerobic Culture w Gram Stain (surgical/deep wound)     Status: None   Collection Time: 01/11/22  8:29 AM   Specimen: PATH Other; Tissue  Result Value Ref Range Status   Specimen Description TOE  Final   Special Requests  LEFT SECOND TOE PARTIAL  Final   Gram Stain   Final    RARE WBC PRESENT, PREDOMINANTLY PMN RARE GRAM POSITIVE RODS    Culture   Final    RARE ENTEROBACTER CANCEROGENUS FEW STAPHYLOCOCCUS COHNII RARE DIPHTHEROIDS(CORYNEBACTERIUM SPECIES) Standardized susceptibility testing for this organism is not available. NO ANAEROBES ISOLATED Performed at [facility], [place] [address], [place], [place] [address]    Report Status 01/16/2022 FINAL  Final   Organism ID, Bacteria ENTEROBACTER CANCEROGENUS  Final   Organism ID, Bacteria STAPHYLOCOCCUS COHNII  Final      Susceptibility   Enterobacter cancerogenus - MIC*    CEFAZOLIN >=64 RESISTANT Resistant     CEFEPIME  0.5 SENSITIVE Sensitive     CEFTAZIDIME >=64 RESISTANT Resistant     CEFTRIAXONE >=64 RESISTANT Resistant     CIPROFLOXACIN <=0.25 SENSITIVE Sensitive     GENTAMICIN <=1 SENSITIVE Sensitive     IMIPENEM <=0.25 SENSITIVE Sensitive     TRIMETH/SULFA <=20 SENSITIVE Sensitive     PIP/TAZO >=128 RESISTANT Resistant     * RARE ENTEROBACTER CANCEROGENUS   Staphylococcus cohnii - MIC*    CIPROFLOXACIN <=0.5 SENSITIVE Sensitive     ERYTHROMYCIN >=8 RESISTANT Resistant     GENTAMICIN <=0.5 SENSITIVE Sensitive     OXACILLIN <=0.25 SENSITIVE Sensitive     TETRACYCLINE <=1 SENSITIVE Sensitive     VANCOMYCIN <=0.5 SENSITIVE Sensitive     TRIMETH/SULFA <=10 SENSITIVE Sensitive     CLINDAMYCIN RESISTANT Resistant     RIFAMPIN <=0.5 SENSITIVE Sensitive     Inducible Clindamycin POSITIVE Resistant     * FEW STAPHYLOCOCCUS COHNII  Aerobic/Anaerobic Culture w Gram Stain (surgical/deep wound)     Status: None   Collection Time: 01/11/22  8:31 AM   Specimen: PATH Other; Wound  Result Value Ref Range Status   Specimen Description WOUND  Final   Special Requests LEFT SECOND TOE WOUND  Final   Gram Stain NO WBC SEEN NO ORGANISMS SEEN   Final   Culture   Final    RARE ENTEROBACTER CANCEROGENUS RARE DIPHTHEROIDS(CORYNEBACTERIUM SPECIES) Standardized susceptibility testing for this organism is not available. NO ANAEROBES ISOLATED SUSCEPTIBILITIES PERFORMED ON PREVIOUS CULTURE WITHIN THE LAST 5 DAYS. Performed at [facility], [place] [address] [place], [place] [address]    Report Status 01/16/2022 FINAL  Final   Radiology DG MINI C-ARM IMAGE ONLY  Result Date: 01/11/2022 There is no interpretation for this exam.  This order is for images obtained during a surgical procedure.  Please See "Surgeries" Tab for more information regarding the procedure.   DG Foot Complete Left  Result Date: 01/03/2022 Clinical: infection of the left second toe, no known trauma X-rays were done of the left foot,  three views. There are diffuse mild degenerative changes of the left foot with spurring of the distal talus, navicular and cuneiform dorsally and spurring of the plantar calcaneus.  No fracture is noted.  Bone quality is normal. Impression:  negative left foot, no acute findings. Electronically Signed Darreld Mclean, MD 9/13/20239:00 AM   Assessment/Plan # Left second toe acute osteomyelitis of the distal phalanx and draining sinus tract 9/21 s/p eft foot 2nd toe partial amputation. OR cx with Enterobacter cancerogenus, staphylococcus cohnii , diptheroids ( corynebacterium spp), no anaerobes. OR notes reviewed. No clean margin or path sent.   She was taking cephalexin prior to her OR on 9/21 and was discharged on PO doxycycline and augmentin post op which she was taking until 9/24 morning.  She started taking Ciprofloxacin starting 9/24 evening and continued taking doxycycline.   Plan to continue Doxycycline and ciprofloxacin for 6 weeks from 9/24. EOT 03/28/22 Discussed common side effects of both abtx and advised to protect from sun exposure while on doxycyline Labs today  Fu with Ortho, she has a fu this afternoon Fu with me in 3-4 weeks  Prediabetes control   I have personally spent 70 minutes involved in face-to-face and non-face-to-face activities for this patient on the day of the visit. Professional time spent includes the following activities: Preparing to see the patient (review of tests), Obtaining and/or reviewing separately obtained history (admission/discharge record), Performing a medically appropriate examination and/or evaluation , Ordering medications/tests/procedures, referring and communicating with other health care professionals, Documenting clinical information in the EMR, Independently interpreting results (not separately reported), Communicating results to the patient/family/caregiver, Counseling and educating the patient/family/caregiver and Care coordination (not separately  reported).   Victoriano Lain, MD [facility] 01/18/2022, 11:05 AM

## 2022-01-19 DIAGNOSIS — Z5181 Encounter for therapeutic drug level monitoring: Secondary | ICD-10-CM | POA: Insufficient documentation

## 2022-01-19 DIAGNOSIS — R7303 Prediabetes: Secondary | ICD-10-CM | POA: Insufficient documentation

## 2022-01-19 DIAGNOSIS — M869 Osteomyelitis, unspecified: Secondary | ICD-10-CM | POA: Insufficient documentation

## 2022-01-19 DIAGNOSIS — G629 Polyneuropathy, unspecified: Secondary | ICD-10-CM | POA: Insufficient documentation

## 2022-01-19 LAB — CBC
HCT: 38.7 % (ref 35.0–45.0)
Hemoglobin: 12.9 g/dL (ref 11.7–15.5)
MCH: 30.6 pg (ref 27.0–33.0)
MCHC: 33.3 g/dL (ref 32.0–36.0)
MCV: 91.7 fL (ref 80.0–100.0)
MPV: 11.9 fL (ref 7.5–12.5)
Platelets: 214 10*3/uL (ref 140–400)
RBC: 4.22 10*6/uL (ref 3.80–5.10)
RDW: 12.8 % (ref 11.0–15.0)
WBC: 7.4 10*3/uL (ref 3.8–10.8)

## 2022-01-19 LAB — COMPREHENSIVE METABOLIC PANEL
AG Ratio: 1.6 (calc) (ref 1.0–2.5)
ALT: 26 U/L (ref 6–29)
AST: 27 U/L (ref 10–35)
Albumin: 4.3 g/dL (ref 3.6–5.1)
Alkaline phosphatase (APISO): 50 U/L (ref 37–153)
BUN: 20 mg/dL (ref 7–25)
CO2: 31 mmol/L (ref 20–32)
Calcium: 9.9 mg/dL (ref 8.6–10.4)
Chloride: 102 mmol/L (ref 98–110)
Creat: 0.95 mg/dL (ref 0.50–1.05)
Globulin: 2.7 g/dL (calc) (ref 1.9–3.7)
Glucose, Bld: 92 mg/dL (ref 65–99)
Potassium: 3.8 mmol/L (ref 3.5–5.3)
Sodium: 141 mmol/L (ref 135–146)
Total Bilirubin: 0.4 mg/dL (ref 0.2–1.2)
Total Protein: 7 g/dL (ref 6.1–8.1)

## 2022-01-19 LAB — C-REACTIVE PROTEIN: CRP: 1 mg/L (ref <8.0)

## 2022-01-19 LAB — SEDIMENTATION RATE: Sed Rate: 22 mm/h (ref 0–30)

## 2022-01-23 ENCOUNTER — Inpatient Hospital Stay: Payer: Medicare PPO | Admitting: Infectious Diseases

## 2022-01-23 DIAGNOSIS — L03116 Cellulitis of left lower limb: Secondary | ICD-10-CM | POA: Diagnosis not present

## 2022-02-01 DIAGNOSIS — M79672 Pain in left foot: Secondary | ICD-10-CM | POA: Diagnosis not present

## 2022-02-01 DIAGNOSIS — L03116 Cellulitis of left lower limb: Secondary | ICD-10-CM | POA: Diagnosis not present

## 2022-02-06 DIAGNOSIS — M79672 Pain in left foot: Secondary | ICD-10-CM | POA: Diagnosis not present

## 2022-02-13 DIAGNOSIS — E785 Hyperlipidemia, unspecified: Secondary | ICD-10-CM | POA: Diagnosis not present

## 2022-02-13 DIAGNOSIS — R7309 Other abnormal glucose: Secondary | ICD-10-CM | POA: Diagnosis not present

## 2022-02-13 DIAGNOSIS — E6609 Other obesity due to excess calories: Secondary | ICD-10-CM | POA: Diagnosis not present

## 2022-02-13 DIAGNOSIS — Z1331 Encounter for screening for depression: Secondary | ICD-10-CM | POA: Diagnosis not present

## 2022-02-13 DIAGNOSIS — Z6833 Body mass index (BMI) 33.0-33.9, adult: Secondary | ICD-10-CM | POA: Diagnosis not present

## 2022-02-13 DIAGNOSIS — L03116 Cellulitis of left lower limb: Secondary | ICD-10-CM | POA: Diagnosis not present

## 2022-02-13 DIAGNOSIS — I10 Essential (primary) hypertension: Secondary | ICD-10-CM | POA: Diagnosis not present

## 2022-02-13 DIAGNOSIS — Z0001 Encounter for general adult medical examination with abnormal findings: Secondary | ICD-10-CM | POA: Diagnosis not present

## 2022-02-15 ENCOUNTER — Encounter: Payer: Self-pay | Admitting: Infectious Diseases

## 2022-02-15 ENCOUNTER — Other Ambulatory Visit: Payer: Self-pay

## 2022-02-15 ENCOUNTER — Ambulatory Visit: Payer: Medicare PPO | Admitting: Infectious Diseases

## 2022-02-15 VITALS — BP 113/72 | HR 63 | Temp 97.4°F | Wt 199.0 lb

## 2022-02-15 DIAGNOSIS — M86172 Other acute osteomyelitis, left ankle and foot: Secondary | ICD-10-CM

## 2022-02-15 DIAGNOSIS — Z5181 Encounter for therapeutic drug level monitoring: Secondary | ICD-10-CM

## 2022-02-15 LAB — BASIC METABOLIC PANEL
BUN: 21 mg/dL (ref 7–25)
CO2: 32 mmol/L (ref 20–32)
Calcium: 9.9 mg/dL (ref 8.6–10.4)
Chloride: 100 mmol/L (ref 98–110)
Creat: 0.87 mg/dL (ref 0.50–1.05)
Glucose, Bld: 72 mg/dL (ref 65–99)
Potassium: 3.8 mmol/L (ref 3.5–5.3)
Sodium: 139 mmol/L (ref 135–146)

## 2022-02-15 LAB — CBC
HCT: 37.8 % (ref 35.0–45.0)
Hemoglobin: 12.8 g/dL (ref 11.7–15.5)
MCH: 30.8 pg (ref 27.0–33.0)
MCHC: 33.9 g/dL (ref 32.0–36.0)
MCV: 91.1 fL (ref 80.0–100.0)
MPV: 12.2 fL (ref 7.5–12.5)
Platelets: 200 10*3/uL (ref 140–400)
RBC: 4.15 10*6/uL (ref 3.80–5.10)
RDW: 12.6 % (ref 11.0–15.0)
WBC: 6.3 10*3/uL (ref 3.8–10.8)

## 2022-02-15 MED ORDER — DOXYCYCLINE HYCLATE 100 MG PO TABS: 100.0000 mg | ORAL_TABLET | Freq: Two times a day (BID) | ORAL | 0 refills | Status: AC

## 2022-02-15 MED ORDER — CIPROFLOXACIN HCL 500 MG PO TABS: 500.0000 mg | ORAL_TABLET | Freq: Two times a day (BID) | ORAL | 0 refills | Status: AC

## 2022-02-15 NOTE — Progress Notes (Signed)
Patient Active Problem List   Diagnosis Date Noted   Medication monitoring encounter 01/19/2022   Pyogenic inflammation of bone (HCC) 01/19/2022   Prediabetes 01/19/2022   Neuropathy 01/19/2022   Change in bowel habits 07/09/2018   History of colonic polyps 07/09/2018    Patient's Medications  New Prescriptions   No medications on file  Previous Medications   AMLODIPINE (NORVASC) 5 MG TABLET    Take 5 mg by mouth daily. Pt takes in the pm   ATORVASTATIN (LIPITOR) 20 MG TABLET    Take 20 mg by mouth daily.   CALCIUM CITRATE-VITAMIN D (CITRACAL PETITES/VITAMIN D PO)    Take 2 tablets by mouth 2 (two) times daily.    CHOLECALCIFEROL (VITAMIN D3) 250 MCG (10000 UT) CAPSULE    Take 10,000 Units by mouth daily.   CIPROFLOXACIN (CIPRO) 500 MG TABLET    Take 1 tablet (500 mg total) by mouth 2 (two) times daily.   CYANOCOBALAMIN (VITAMIN B12) 1000 MCG TABLET    Take 1,000 mcg by mouth daily.   DOXYCYCLINE (VIBRA-TABS) 100 MG TABLET    Take 1 tablet (100 mg total) by mouth 2 (two) times daily.   FOLIC ACID (FOLVITE) 800 MCG TABLET    Take 800 mcg by mouth daily.    GABAPENTIN (NEURONTIN) 300 MG CAPSULE    Take 300 mg by mouth 2 (two) times daily.   IBUPROFEN-ACETAMINOPHEN (ADVIL DUAL ACTION) 125-250 MG TABS    Take 2 tablets by mouth daily as needed (pain).   MULTIPLE VITAMIN (MULTIVITAMIN) TABLET    Take 1 tablet by mouth daily.   OLMESARTAN-HYDROCHLOROTHIAZIDE (BENICAR HCT) 40-25 MG TABLET    Take 1 tablet by mouth daily. Pt takes in the am   OMEGA-3 FATTY ACIDS (FISH OIL) 1000 MG CAPS    Take 1,000 mg by mouth daily.  Modified Medications   No medications on file  Discontinued Medications   No medications on file    Subjective: 66 Y O Female with PMH of GERD, Arthritis, HTN, Prediabetes, Neuropathy who is referred from Dr Odis Hollingshead for concerns of left second toe OM. Patient was discharged on 9/21 after an elective OP OR on 9/21 with 2 weeks course of PO doxycycline and  augmentin pending OR cultures   She was working in her garden and was doing some weed eating on 9/7. She had some tightness and swelling in her left foot the following day. She does not remember any specific trauma.injury or insect bite. She soaked her foot in the epsom salt and following day soaked with some alcohol. She remembers having callus removed from the tip of her left second toe back in August 2023 and it was uneventful with no concerns afterwards. She was seen by her PCP who gave her 1 shot of ceftriaxone daily for 2 days and also a course of augmentin + mupirocin ointment. Seen by Dr Cherylann Banas on 9/13 and advised to fu with Dr Charlann Boxer as well as a course of doxycycline. 9/13 left foot xray unremarkable. She was supposed to get Left total knee replaced on 9/19 with Dr Charlann Boxer. Patient was seen by Dr Charlann Boxer 9/14, was given 2 weeks course of cephalexin. MRI was ordered ( cannot view results) and deemed to have left second toe  distal osteomyelitis with presence of sinus tract and underwent Left foot 2nd toe partial amputation 9/21. OR cultures polymicrobial as below.   She has prediabetes and neuropathy but no known vascular disease of  lower extremity.  Denies any fevers, chills and night sweats. Denies nausea, vomiting, abdominal pain and diarrhea. Denies chest pain, cough and SOB. Denies GU symptoms and rashes. Denies headache, blurry vision, weakness, numbness and tingling. Quit smoking 2008, denies alcohol and IVDU. Her 2 sons live with her.  02/15/22 She is taking doxycycline and ciprofloxacin twice daily without missing doses. She tells me that after she started bathing few days following her surgery on 9/21, she had a blister develop in her left great toe which is now trying to scab. Denies any pain, swelling and tenderness. Saw Dr Odis Hollingshead last Tuesday and left second toes wound had healed.  MRI left foot 10/17 with findings as below. Denies any fevers, chills, sweats. Denies nausea, vomiting  and diarrhea. She has no complaints otherwise.   Review of Systems: all systems reviewed with pertinent positives and negatives as listed above   Past Medical History:  Diagnosis Date   Arthritis    GERD (gastroesophageal reflux disease)    Hypertension    Neuropathy    Osteomyelitis (HCC)    left 2nd toe   Pneumonia    as a child   Pre-diabetes    Past Surgical History:  Procedure Laterality Date   ABDOMINAL HYSTERECTOMY     AMPUTATION TOE Left 01/11/2022   Procedure: Left 2nd toe partial amputation;  Surgeon: Netta Cedars, MD;  Location: [facility];  Service: Orthopedics;  Laterality: Left;  60 min   BIOPSY  03/13/2018   Procedure: BIOPSY;  Surgeon: Corbin Ade, MD;  [facility];  Service: Endoscopy;;  (colon)   BLADDER SUSPENSION     COLONOSCOPY WITH PROPOFOL N/A 03/13/2018   Procedure: COLONOSCOPY WITH PROPOFOL;  Surgeon: Corbin Ade, MD;  [facility];  Service: Endoscopy;  Laterality: N/A;  1:00pm-rescheduled to 11/21 @ 1:30pm per Mindy     Social History   Tobacco Use   Smoking status: Former   Smokeless tobacco: Never  Building services engineer Use: Never used  Substance Use Topics   Alcohol use: Never   Drug use: Never    No family history on file.  No Known Allergies  Health Maintenance  Topic Date Due   Medicare Annual Wellness (AWV)  Never done   COVID-19 Vaccine (1) Never done   Hepatitis C Screening  Never done   TETANUS/TDAP  Never done   Zoster Vaccines- Shingrix (1 of 2) Never done   Pneumonia Vaccine 48+ Years old (1 - PCV) Never done   INFLUENZA VACCINE  Never done   MAMMOGRAM  05/05/2023   COLONOSCOPY (Pts 45-11yrs Insurance coverage will need to be confirmed)  03/13/2028   DEXA SCAN  Completed   HPV VACCINES  Aged Out    Objective: BP 113/72   Pulse 63   Temp (!) 97.4 F (36.3 C) (Oral)   Wt 199 lb (90.3 kg)   SpO2 96%   BMI 33.12 kg/m    Physical Exam Constitutional:       Appearance: Normal appearance.  HENT:     Head: Normocephalic and atraumatic.      Mouth: Mucous membranes are moist.  Eyes:    Conjunctiva/sclera: Conjunctivae normal.     Pupils:   Cardiovascular:     Rate and Rhythm: Normal rate and regular rhythm.     Heart sounds:   Pulmonary:     Effort: Pulmonary effort is normal.     Breath sounds: Normal breath sounds.  Abdominal:     General: Non distended     Palpations: soft.   Musculoskeletal:        General: Normal range of motion.   Left second toe - amputated site has healed with no signs of infection  Left great toes has a blackish colored scab with no surounding redness, tenderness, crepitus or fluctuance. ROM of left great toe is good.  Left third toe with no signs of cellulitis or wound        Skin:    General: Skin is warm and dry.     Comments:  Neurological:     General: grossly non focal     Mental Status: awake, alert and oriented to person, place, and time.   Psychiatric:        Mood and Affect: Mood normal.   Lab Results Lab Results  Component Value Date   WBC 7.4 01/18/2022   HGB 12.9 01/18/2022   HCT 38.7 01/18/2022   MCV 91.7 01/18/2022   PLT 214 01/18/2022    Lab Results  Component Value Date   CREATININE 0.95 01/18/2022   BUN 20 01/18/2022   NA 141 01/18/2022   K 3.8 01/18/2022   CL 102 01/18/2022   CO2 31 01/18/2022    Lab Results  Component Value Date   ALT 26 01/18/2022   AST 27 01/18/2022   ALKPHOS 53 03/11/2018   BILITOT 0.4 01/18/2022    No results found for: "CHOL", "HDL", "LDLCALC", "LDLDIRECT", "TRIG", "CHOLHDL" No results found for: "LABRPR", "RPRTITER" No results found for: "HIV1RNAQUANT", "HIV1RNAVL", "CD4TABS"   Microbiology Results for orders placed or performed during the hospital encounter of 01/11/22  Aerobic/Anaerobic Culture w Gram Stain (surgical/deep wound)     Status: None   Collection Time: 01/11/22  8:29 AM   Specimen: PATH Other; Tissue  Result Value  Ref Range Status   Specimen Description TOE  Final   Special Requests  LEFT SECOND TOE PARTIAL  Final   Gram Stain   Final    RARE WBC PRESENT, PREDOMINANTLY PMN RARE GRAM POSITIVE RODS    Culture   Final    RARE ENTEROBACTER CANCEROGENUS FEW STAPHYLOCOCCUS COHNII RARE DIPHTHEROIDS(CORYNEBACTERIUM SPECIES) Standardized susceptibility testing for this organism is not available. NO ANAEROBES ISOLATED Performed at [facility], [place] [address] [place], [place] [address]    Report Status 01/16/2022 FINAL  Final   Organism ID, Bacteria ENTEROBACTER CANCEROGENUS  Final   Organism ID, Bacteria STAPHYLOCOCCUS COHNII  Final      Susceptibility   Enterobacter cancerogenus - MIC*    CEFAZOLIN >=64 RESISTANT Resistant     CEFEPIME 0.5 SENSITIVE Sensitive     CEFTAZIDIME >=64 RESISTANT Resistant     CEFTRIAXONE >=64 RESISTANT Resistant     CIPROFLOXACIN <=0.25 SENSITIVE Sensitive     GENTAMICIN <=1 SENSITIVE Sensitive     IMIPENEM <=0.25 SENSITIVE Sensitive     TRIMETH/SULFA <=20 SENSITIVE Sensitive     PIP/TAZO >=128 RESISTANT Resistant     * RARE ENTEROBACTER CANCEROGENUS   Staphylococcus cohnii - MIC*    CIPROFLOXACIN <=0.5 SENSITIVE Sensitive     ERYTHROMYCIN >=8 RESISTANT Resistant     GENTAMICIN <=0.5 SENSITIVE Sensitive     OXACILLIN <=0.25 SENSITIVE Sensitive     TETRACYCLINE <=1 SENSITIVE Sensitive     VANCOMYCIN <=0.5 SENSITIVE Sensitive     TRIMETH/SULFA <=10 SENSITIVE Sensitive     CLINDAMYCIN RESISTANT Resistant     RIFAMPIN <=0.5 SENSITIVE Sensitive     Inducible  Clindamycin POSITIVE Resistant     * FEW STAPHYLOCOCCUS COHNII  Aerobic/Anaerobic Culture w Gram Stain (surgical/deep wound)     Status: None   Collection Time: 01/11/22  8:31 AM   Specimen: PATH Other; Wound  Result Value Ref Range Status   Specimen Description WOUND  Final   Special Requests LEFT SECOND TOE WOUND  Final   Gram Stain NO WBC SEEN NO ORGANISMS SEEN   Final   Culture   Final    RARE  ENTEROBACTER CANCEROGENUS RARE DIPHTHEROIDS(CORYNEBACTERIUM SPECIES) Standardized susceptibility testing for this organism is not available. NO ANAEROBES ISOLATED SUSCEPTIBILITIES PERFORMED ON PREVIOUS CULTURE WITHIN THE LAST 5 DAYS. Performed at [facility], [address] [place], [place] [address]    Report Status 01/16/2022 FINAL  Final   Radiology    Assessment/Plan # Left second toe acute osteomyelitis of the distal phalanx and draining sinus tract 9/21 s/p eft foot 2nd toe partial amputation. OR cx with Enterobacter cancerogenus, staphylococcus cohnii , diptheroids ( corynebacterium spp), no anaerobes. OR notes reviewed. No clean margin or path sent.   She was taking cephalexin prior to her OR on 9/21 and was discharged on PO doxycycline and augmentin post op which she was taking until 9/24 morning.  She started taking Ciprofloxacin starting 9/24 evening and continued taking doxycycline.   Plan to continue Doxycycline and ciprofloxacin for 6 weeks from 9/24. EOT 02/26/22 Fu in a months   # left third toe # with question of osteomyelitis in MRI - no wound on left third toe nor any cellulitis + - Unlikely to be osteomyelitis and likely over read in due to presence of #  I have personally spent  45 minutes involved in face-to-face and non-face-to-face activities for this patient on the day of the visit including counseling of the patient and coordination of care.   Victoriano Lain, MD [facility] 02/15/2022, 2:39 PM

## 2022-03-01 DIAGNOSIS — L03116 Cellulitis of left lower limb: Secondary | ICD-10-CM | POA: Diagnosis not present

## 2022-03-08 ENCOUNTER — Telehealth: Payer: Self-pay

## 2022-03-08 NOTE — Telephone Encounter (Signed)
Patient informed and verbalized understanding

## 2022-03-08 NOTE — Telephone Encounter (Signed)
Patient calling requesting refills on doxy and cipro. Patient stating she was advised to continue until her next OV on 03/26/22. Per last office note EOT 02/26/22. Do you want me to send in refills for the patient? Gayla Benn T Pricilla Loveless

## 2022-03-26 ENCOUNTER — Ambulatory Visit: Payer: Medicare PPO | Admitting: Infectious Diseases

## 2022-03-28 ENCOUNTER — Encounter: Payer: Self-pay | Admitting: Infectious Diseases

## 2022-03-28 ENCOUNTER — Ambulatory Visit (INDEPENDENT_AMBULATORY_CARE_PROVIDER_SITE_OTHER): Payer: Medicare PPO | Admitting: Infectious Diseases

## 2022-03-28 ENCOUNTER — Other Ambulatory Visit: Payer: Self-pay

## 2022-03-28 VITALS — BP 124/74 | HR 80 | Temp 97.7°F | Ht 65.5 in | Wt 201.0 lb

## 2022-03-28 DIAGNOSIS — R7303 Prediabetes: Secondary | ICD-10-CM | POA: Diagnosis not present

## 2022-03-28 DIAGNOSIS — M86172 Other acute osteomyelitis, left ankle and foot: Secondary | ICD-10-CM | POA: Diagnosis not present

## 2022-03-28 DIAGNOSIS — Z5181 Encounter for therapeutic drug level monitoring: Secondary | ICD-10-CM

## 2022-03-28 NOTE — Progress Notes (Signed)
Patient Active Problem List   Diagnosis Date Noted   Medication monitoring encounter 01/19/2022   Pyogenic inflammation of bone (HCC) 01/19/2022   Prediabetes 01/19/2022   Neuropathy 01/19/2022   Change in bowel habits 07/09/2018   History of colonic polyps 07/09/2018    Subjective: 66 Y O Female with PMH of GERD, Arthritis, HTN, Prediabetes, Neuropathy who is referred from Dr Odis Hollingshead for concerns of left second toe OM. Patient was discharged on 9/21 after an elective OP OR on 9/21 with 2 weeks course of PO doxycycline and augmentin pending OR cultures   She was working in her garden and was doing some weed eating on 9/7. She had some tightness and swelling in her left foot the following day. She does not remember any specific trauma.injury or insect bite. She soaked her foot in the epsom salt and following day soaked with some alcohol. She remembers having callus removed from the tip of her left second toe back in August 2023 and it was uneventful with no concerns afterwards. She was seen by her PCP who gave her 1 shot of ceftriaxone daily for 2 days and also a course of augmentin + mupirocin ointment. Seen by Dr Cherylann Banas on 9/13 and advised to fu with Dr Charlann Boxer as well as a course of doxycycline. 9/13 left foot xray unremarkable. She was supposed to get Left total knee replaced on 9/19 with Dr Charlann Boxer. Patient was seen by Dr Charlann Boxer 9/14, was given 2 weeks course of cephalexin. MRI was ordered ( cannot view results) and deemed to have left second toe  distal osteomyelitis with presence of sinus tract and underwent Left foot 2nd toe partial amputation 9/21. OR cultures polymicrobial as below.   She has prediabetes and neuropathy but no known vascular disease of lower extremity.  Denies any fevers, chills and night sweats. Denies nausea, vomiting, abdominal pain and diarrhea. Denies chest pain, cough and SOB. Denies GU symptoms and rashes. Denies headache, blurry vision, weakness, numbness  and tingling. Quit smoking 2008, denies alcohol and IVDU. Her 2 sons live with her.  02/15/22 She is taking doxycycline and ciprofloxacin twice daily without missing doses. She tells me that after she started bathing few days following her surgery on 9/21, she had a blister develop in her left great toe which is now trying to scab. Denies any pain, swelling and tenderness. Saw Dr Odis Hollingshead last Tuesday and left second toes wound had healed.  MRI left foot 10/17 with findings as below. Denies any fevers, chills, sweats. Denies nausea, vomiting and diarrhea. She has no complaints otherwise.   03/28/22 Took last dose of doxycyline and augmentin in 11/17 and off abtx since then. Denies fevers, chills, sweats. Denies nausea, vomiting and diarrhea, Last seen by ortho Dr Odis Hollingshead 11/9, left great toe was thought to be doing well with no concerns of infection. She has an appt with him on 12/21 and hopes to be released from ortho as well. She is pleased that the wound has healed. No concerns otherwise.   Review of Systems: all systems reviewed with pertinent positives and negatives as listed above   Past Medical History:  Diagnosis Date   Arthritis    GERD (gastroesophageal reflux disease)    Hypertension    Neuropathy    Osteomyelitis (HCC)    left 2nd toe   Pneumonia    as a child   Pre-diabetes    Past Surgical History:  Procedure Laterality Date   ABDOMINAL  HYSTERECTOMY     AMPUTATION TOE Left 01/11/2022   Procedure: Left 2nd toe partial amputation;  Surgeon: Netta Cedars, MD;  Location: [facility];  Service: Orthopedics;  Laterality: Left;  60 min   BIOPSY  03/13/2018   Procedure: BIOPSY;  Surgeon: Corbin Ade, MD;  [facility];  Service: Endoscopy;;  (colon)   BLADDER SUSPENSION     COLONOSCOPY WITH PROPOFOL N/A 03/13/2018   Procedure: COLONOSCOPY WITH PROPOFOL;  Surgeon: Corbin Ade, MD;  [facility];  Service: Endoscopy;  Laterality:  N/A;  1:00pm-rescheduled to 11/21 @ 1:30pm per Mindy     Social History   Tobacco Use   Smoking status: Former   Smokeless tobacco: Never  Building services engineer Use: Never used  Substance Use Topics   Alcohol use: Never   Drug use: Never    No family history on file.  No Known Allergies  Health Maintenance  Topic Date Due   Medicare Annual Wellness (AWV)  Never done   COVID-19 Vaccine (1) Never done   Hepatitis C Screening  Never done   DTaP/Tdap/Td (1 - Tdap) Never done   Zoster Vaccines- Shingrix (1 of 2) Never done   Pneumonia Vaccine 84+ Years old (1 - PCV) Never done   INFLUENZA VACCINE  Never done   MAMMOGRAM  05/05/2023   COLONOSCOPY (Pts 45-22yrs Insurance coverage will need to be confirmed)  03/13/2028   DEXA SCAN  Completed   HPV VACCINES  Aged Out    Objective: BP 124/74   Pulse 80   Temp 97.7 F (36.5 C) (Oral)   Ht 5' 5.5" (1.664 m)   Wt 201 lb (91.2 kg)   SpO2 93%   BMI 32.94 kg/m   Physical Exam Constitutional:      Appearance: Normal appearance.  HENT:     Head: Normocephalic and atraumatic.      Mouth: Mucous membranes are moist.  Eyes:    Conjunctiva/sclera: Conjunctivae normal.     Pupils:   Cardiovascular:     Rate and Rhythm: Normal rate and regular rhythm.     Heart sounds:   Pulmonary:     Effort: Pulmonary effort is normal.     Breath sounds: Normal breath sounds.   Abdominal:     General: Non distended     Palpations: soft.   Musculoskeletal:        General: Normal range of motion.   Left second toe - amputated site has healed with no signs of infection  Left great toe has healed with no wound/signs of infection. ROM of left great toe is good.  Left third toe with no signs of cellulitis or wound   Skin:    General: Skin is warm and dry.     Comments:  Neurological:     General: grossly non focal     Mental Status: awake, alert and oriented to person, place, and time.   Psychiatric:        Mood and Affect: Mood  normal.   Lab Results Lab Results  Component Value Date   WBC 6.3 02/15/2022   HGB 12.8 02/15/2022   HCT 37.8 02/15/2022   MCV 91.1 02/15/2022   PLT 200 02/15/2022    Lab Results  Component Value Date   CREATININE 0.87 02/15/2022   BUN 21 02/15/2022   NA 139 02/15/2022   K 3.8 02/15/2022   CL 100 02/15/2022   CO2 32 02/15/2022  Lab Results  Component Value Date   ALT 26 01/18/2022   AST 27 01/18/2022   ALKPHOS 53 03/11/2018   BILITOT 0.4 01/18/2022    No results found for: "CHOL", "HDL", "LDLCALC", "LDLDIRECT", "TRIG", "CHOLHDL" No results found for: "LABRPR", "RPRTITER" No results found for: "HIV1RNAQUANT", "HIV1RNAVL", "CD4TABS"   Microbiology Results for orders placed or performed during the hospital encounter of 01/11/22  Aerobic/Anaerobic Culture w Gram Stain (surgical/deep wound)     Status: None   Collection Time: 01/11/22  8:29 AM   Specimen: PATH Other; Tissue  Result Value Ref Range Status   Specimen Description TOE  Final   Special Requests  LEFT SECOND TOE PARTIAL  Final   Gram Stain   Final    RARE WBC PRESENT, PREDOMINANTLY PMN RARE GRAM POSITIVE RODS    Culture   Final    RARE ENTEROBACTER CANCEROGENUS FEW STAPHYLOCOCCUS COHNII RARE DIPHTHEROIDS(CORYNEBACTERIUM SPECIES) Standardized susceptibility testing for this organism is not available. NO ANAEROBES ISOLATED Performed at [facility], [address] [place], [place] [address]    Report Status 01/16/2022 FINAL  Final   Organism ID, Bacteria ENTEROBACTER CANCEROGENUS  Final   Organism ID, Bacteria STAPHYLOCOCCUS COHNII  Final      Susceptibility   Enterobacter cancerogenus - MIC*    CEFAZOLIN >=64 RESISTANT Resistant     CEFEPIME 0.5 SENSITIVE Sensitive     CEFTAZIDIME >=64 RESISTANT Resistant     CEFTRIAXONE >=64 RESISTANT Resistant     CIPROFLOXACIN <=0.25 SENSITIVE Sensitive     GENTAMICIN <=1 SENSITIVE Sensitive     IMIPENEM <=0.25 SENSITIVE Sensitive     TRIMETH/SULFA <=20  SENSITIVE Sensitive     PIP/TAZO >=128 RESISTANT Resistant     * RARE ENTEROBACTER CANCEROGENUS   Staphylococcus cohnii - MIC*    CIPROFLOXACIN <=0.5 SENSITIVE Sensitive     ERYTHROMYCIN >=8 RESISTANT Resistant     GENTAMICIN <=0.5 SENSITIVE Sensitive     OXACILLIN <=0.25 SENSITIVE Sensitive     TETRACYCLINE <=1 SENSITIVE Sensitive     VANCOMYCIN <=0.5 SENSITIVE Sensitive     TRIMETH/SULFA <=10 SENSITIVE Sensitive     CLINDAMYCIN RESISTANT Resistant     RIFAMPIN <=0.5 SENSITIVE Sensitive     Inducible Clindamycin POSITIVE Resistant     * FEW STAPHYLOCOCCUS COHNII  Aerobic/Anaerobic Culture w Gram Stain (surgical/deep wound)     Status: None   Collection Time: 01/11/22  8:31 AM   Specimen: PATH Other; Wound  Result Value Ref Range Status   Specimen Description WOUND  Final   Special Requests LEFT SECOND TOE WOUND  Final   Gram Stain NO WBC SEEN NO ORGANISMS SEEN   Final   Culture   Final    RARE ENTEROBACTER CANCEROGENUS RARE DIPHTHEROIDS(CORYNEBACTERIUM SPECIES) Standardized susceptibility testing for this organism is not available. NO ANAEROBES ISOLATED SUSCEPTIBILITIES PERFORMED ON PREVIOUS CULTURE WITHIN THE LAST 5 DAYS. Performed at [facility], [address] [place], [place] [address]    Report Status 01/16/2022 FINAL  Final   Radiology    Assessment/Plan # Left second toe acute osteomyelitis of the distal phalanx and draining sinus tract 9/21 s/p left foot 2nd toe partial amputation. OR cx with Enterobacter cancerogenus, staphylococcus cohnii , diptheroids ( corynebacterium spp), no anaerobes. OR notes reviewed. No clean margin or path sent.   She was taking cephalexin prior to her OR on 9/21 and was discharged on PO doxycycline and augmentin post op which she was taking until 9/24 morning.  She started  taking Ciprofloxacin starting 9/24 evening and continued taking doxycycline.  Completed Doxycycline and ciprofloxacin for 6 weeks from 9/24. EOT  02/26/22 Clinically left foot  has healed with no signs of infection with normal inflammatory markers  Fu as needed  # Medication monitoring  - off abtx since 11/17, no need for further abtx   I have personally spent  45 minutes involved in face-to-face and non-face-to-face activities for this patient on the day of the visit including counseling of the patient and coordination of care.   Victoriano LainSabina Manadhar, MD [facility] 03/28/2022, 2:29 PM

## 2022-04-12 DIAGNOSIS — M1712 Unilateral primary osteoarthritis, left knee: Secondary | ICD-10-CM | POA: Diagnosis not present

## 2022-04-12 DIAGNOSIS — L03116 Cellulitis of left lower limb: Secondary | ICD-10-CM | POA: Diagnosis not present

## 2022-04-19 ENCOUNTER — Other Ambulatory Visit (HOSPITAL_COMMUNITY): Payer: Self-pay | Admitting: Family Medicine

## 2022-04-19 DIAGNOSIS — Z1231 Encounter for screening mammogram for malignant neoplasm of breast: Secondary | ICD-10-CM

## 2022-05-02 DIAGNOSIS — M25562 Pain in left knee: Secondary | ICD-10-CM | POA: Diagnosis not present

## 2022-05-02 DIAGNOSIS — M1712 Unilateral primary osteoarthritis, left knee: Secondary | ICD-10-CM | POA: Diagnosis not present

## 2022-05-07 ENCOUNTER — Ambulatory Visit (HOSPITAL_COMMUNITY)
Admission: RE | Admit: 2022-05-07 | Discharge: 2022-05-07 | Disposition: A | Discharge status: Home / Self Care | Payer: Medicare PPO | Source: Ambulatory Visit | Attending: Family Medicine | Admitting: Family Medicine

## 2022-05-07 DIAGNOSIS — Z1231 Encounter for screening mammogram for malignant neoplasm of breast: Secondary | ICD-10-CM | POA: Insufficient documentation

## 2022-05-24 DIAGNOSIS — L989 Disorder of the skin and subcutaneous tissue, unspecified: Secondary | ICD-10-CM | POA: Diagnosis not present

## 2022-05-24 DIAGNOSIS — Z6833 Body mass index (BMI) 33.0-33.9, adult: Secondary | ICD-10-CM | POA: Diagnosis not present

## 2022-05-24 DIAGNOSIS — E669 Obesity, unspecified: Secondary | ICD-10-CM | POA: Diagnosis not present

## 2022-05-24 DIAGNOSIS — I10 Essential (primary) hypertension: Secondary | ICD-10-CM | POA: Diagnosis not present

## 2022-05-29 DIAGNOSIS — M1712 Unilateral primary osteoarthritis, left knee: Secondary | ICD-10-CM | POA: Diagnosis not present

## 2022-05-29 DIAGNOSIS — E119 Type 2 diabetes mellitus without complications: Secondary | ICD-10-CM | POA: Diagnosis not present

## 2022-05-29 DIAGNOSIS — Z01818 Encounter for other preprocedural examination: Secondary | ICD-10-CM | POA: Diagnosis not present

## 2022-05-29 DIAGNOSIS — Z6832 Body mass index (BMI) 32.0-32.9, adult: Secondary | ICD-10-CM | POA: Diagnosis not present

## 2022-05-29 DIAGNOSIS — S61512A Laceration without foreign body of left wrist, initial encounter: Secondary | ICD-10-CM | POA: Diagnosis not present

## 2022-05-29 DIAGNOSIS — E6609 Other obesity due to excess calories: Secondary | ICD-10-CM | POA: Diagnosis not present

## 2022-05-29 DIAGNOSIS — E785 Hyperlipidemia, unspecified: Secondary | ICD-10-CM | POA: Diagnosis not present

## 2022-05-29 DIAGNOSIS — R7309 Other abnormal glucose: Secondary | ICD-10-CM | POA: Diagnosis not present

## 2022-05-29 DIAGNOSIS — I10 Essential (primary) hypertension: Secondary | ICD-10-CM | POA: Diagnosis not present

## 2022-06-05 DIAGNOSIS — M1712 Unilateral primary osteoarthritis, left knee: Secondary | ICD-10-CM | POA: Diagnosis not present

## 2022-06-07 DIAGNOSIS — M25562 Pain in left knee: Secondary | ICD-10-CM | POA: Diagnosis not present

## 2022-06-07 DIAGNOSIS — M1712 Unilateral primary osteoarthritis, left knee: Secondary | ICD-10-CM | POA: Diagnosis not present

## 2022-06-20 NOTE — Progress Notes (Addendum)
COVID Vaccine Completed: yes  Date of COVID positive in last 90 days: no  PCP - Sharilyn Sites, MD Cardiologist - n/a  Medical/cardiac clearance by Sharilyn Sites 05/31/22 on chart  Chest x-ray - n/a EKG - 12/28/21 Epic Stress Test - n/a ECHO - n/a Cardiac Cath - n/a Pacemaker/ICD device last checked: n/a Spinal Cord Stimulator: n/a  Bowel Prep - no  Sleep Study - n/a CPAP -   Fasting Blood Sugar - preDM Checks Blood Sugar no checks at home  Last dose of GLP1 agonist-  N/A GLP1 instructions:  N/A   Last dose of SGLT-2 inhibitors-  N/A SGLT-2 instructions: N/A   Blood Thinner Instructions: n/a Aspirin Instructions: Last Dose:  Activity level: Can go up a flight of stairs and perform activities of daily living without stopping and without symptoms of chest pain or shortness of breath. Avoids stairs due to knee pain  Anesthesia review: left fascicular block, HTN  Patient denies shortness of breath, fever, cough and chest pain at PAT appointment  Patient verbalized understanding of instructions that were given to them at the PAT appointment. Patient was also instructed that they will need to review over the PAT instructions again at home before surgery.

## 2022-06-20 NOTE — Patient Instructions (Signed)
SURGICAL WAITING ROOM VISITATION  Patients having surgery or a procedure may have no more than 2 support people in the waiting area - these visitors may rotate.    Children under the age of 37 must have an adult with them who is not the patient.  Due to an increase in RSV and influenza rates and associated hospitalizations, children ages 64 and under may not visit patients in [place].  If the patient needs to stay at the hospital during part of their recovery, the visitor guidelines for inpatient rooms apply. Pre-op nurse will coordinate an appropriate time for 1 support person to accompany patient in pre-op.  This support person may not rotate.    Please refer to the [facility] website for the visitor guidelines for Inpatients (after your surgery is over and you are in a regular room).    Your procedure is scheduled on: 06/28/22   Report to [facility] Main Entrance    Report to admitting at 5:15 AM   Call this number if you have problems the morning of surgery [phone]   Do not eat food :After Midnight.   After Midnight you may have the following liquids until 4:30 AM DAY OF SURGERY  Water Non-Citrus Juices (without pulp, NO RED-Apple, White grape, White cranberry) Black Coffee (NO MILK/CREAM OR CREAMERS, sugar ok)  Clear Tea (NO MILK/CREAM OR CREAMERS, sugar ok) regular and decaf                             Plain Jell-O (NO RED)                                           Fruit ices (not with fruit pulp, NO RED)                                     Popsicles (NO RED)                                                               Sports drinks like Gatorade (NO RED)                 The day of surgery:  Drink ONE (1) Pre-Surgery G2 at 4:30 AM the morning of surgery. Drink in one sitting. Do not sip.  This drink was given to you during your hospital  pre-op appointment visit. Nothing else to drink after completing the  Pre-Surgery G2.          If you  have questions, please contact your surgeon's office.   FOLLOW BOWEL PREP AND ANY ADDITIONAL PRE OP INSTRUCTIONS YOU RECEIVED FROM YOUR SURGEON'S OFFICE!!!     Oral Hygiene is also important to reduce your risk of infection.                                    Remember - BRUSH YOUR TEETH THE MORNING OF SURGERY WITH YOUR REGULAR TOOTHPASTE  DENTURES WILL BE REMOVED  PRIOR TO SURGERY PLEASE DO NOT APPLY "Poly grip" OR ADHESIVES!!!   Take these medicines the morning of surgery with A SIP OF WATER: Atorvastatin   DO NOT TAKE ANY ORAL DIABETIC MEDICATIONS DAY OF YOUR SURGERY  How to Manage Your Diabetes Before and After Surgery  Why is it important to control my blood sugar before and after surgery? Improving blood sugar levels before and after surgery helps healing and can limit problems. A way of improving blood sugar control is eating a healthy diet by:  Eating less sugar and carbohydrates  Increasing activity/exercise  Talking with your doctor about reaching your blood sugar goals High blood sugars (greater than 180 mg/dL) can raise your risk of infections and slow your recovery, so you will need to focus on controlling your diabetes during the weeks before surgery. Make sure that the doctor who takes care of your diabetes knows about your planned surgery including the date and location.  How do I manage my blood sugar before surgery? Check your blood sugar at least 4 times a day, starting 2 days before surgery, to make sure that the level is not too high or low. Check your blood sugar the morning of your surgery when you wake up and every 2 hours until you get to the Short Stay unit. If your blood sugar is less than 70 mg/dL, you will need to treat for low blood sugar: Do not take insulin. Treat a low blood sugar (less than 70 mg/dL) with  cup of clear juice (cranberry or apple), 4 glucose tablets, OR glucose gel. Recheck blood sugar in 15 minutes after treatment (to make sure it is  greater than 70 mg/dL). If your blood sugar is not greater than 70 mg/dL on recheck, call [phone] for further instructions. Report your blood sugar to the short stay nurse when you get to Short Stay.  If you are admitted to the hospital after surgery: Your blood sugar will be checked by the staff and you will probably be given insulin after surgery (instead of oral diabetes medicines) to make sure you have good blood sugar levels. The goal for blood sugar control after surgery is 80-180 mg/dL.  Reviewed and Endorsed by [facility] Patient Education Committee, August 2015                              You may not have any metal on your body including hair pins, jewelry, and body piercing             Do not wear make-up, lotions, powders, cologne, or deodorant  Do not wear nail polish including gel and S&S, artificial/acrylic nails, or any other type of covering on natural nails including finger and toenails. If you have artificial nails, gel coating, etc. that needs to be removed by a nail salon please have this removed prior to surgery or surgery may need to be canceled/ delayed if the surgeon/ anesthesia feels like they are unable to be safely monitored.   Do not shave  48 hours prior to surgery.    Do not bring valuables to the hospital. [place].   Contacts, glasses, dentures or bridgework may not be worn into surgery.   Bring small overnight bag day of surgery.   DO NOT [place]. PHARMACY WILL DISPENSE  MEDICATIONS LISTED ON YOUR MEDICATION LIST TO YOU DURING YOUR ADMISSION [place]!              Please read over the following fact sheets you were given: IF [place] [phone] Schneiders    If you received a COVID test during your pre-op visit  it is requested that you wear a mask when out in public, stay away from anyone that may not be  feeling well and notify your surgeon if you develop symptoms. If you test positive for Covid or have been in contact with anyone that has tested positive in the last 10 days please notify you surgeon.    [place] - Preparing for Surgery Before surgery, you can play an important role.  Because skin is not sterile, your skin needs to be as free of germs as possible.  You can reduce the number of germs on your skin by washing with CHG (chlorahexidine gluconate) soap before surgery.  CHG is an antiseptic cleaner which kills germs and bonds with the skin to continue killing germs even after washing. Please DO NOT use if you have an allergy to CHG or antibacterial soaps.  If your skin becomes reddened/irritated stop using the CHG and inform your nurse when you arrive at Short Stay. Do not shave (including legs and underarms) for at least 48 hours prior to the first CHG shower.  You may shave your face/neck.  Please follow these instructions carefully:  1.  Shower with CHG Soap the night before surgery and the  morning of surgery.  2.  If you choose to wash your hair, wash your hair first as usual with your normal  shampoo.  3.  After you shampoo, rinse your hair and body thoroughly to remove the shampoo.                             4.  Use CHG as you would any other liquid soap.  You can apply chg directly to the skin and wash.  Gently with a scrungie or clean washcloth.  5.  Apply the CHG Soap to your body ONLY FROM THE NECK DOWN.   Do   not use on face/ open                           Wound or open sores. Avoid contact with eyes, ears mouth and   genitals (private parts).                       Wash face,  Genitals (private parts) with your normal soap.             6.  Wash thoroughly, paying special attention to the area where your    surgery  will be performed.  7.  Thoroughly rinse your body with warm water from the neck down.  8.  DO NOT shower/wash with your normal soap after using and rinsing off  the CHG Soap.                9.  Pat yourself dry with a clean towel.            10.  Wear clean pajamas.            11.  Place clean sheets on your bed the night of your first shower  and do not  sleep with pets. Day of Surgery : Do not apply any lotions/deodorants the morning of surgery.  Please wear clean clothes to the hospital/surgery center.  FAILURE TO FOLLOW THESE INSTRUCTIONS MAY RESULT IN THE CANCELLATION OF YOUR SURGERY  PATIENT SIGNATURE_________________________________  NURSE SIGNATURE__________________________________  ________________________________________________________________________  Adam Phenix  An incentive spirometer is a tool that can help keep your lungs clear and active. This tool measures how well you are filling your lungs with each breath. Taking long deep breaths may help reverse or decrease the chance of developing breathing (pulmonary) problems (especially infection) following: A long period of time when you are unable to move or be active. BEFORE THE PROCEDURE  If the spirometer includes an indicator to show your best effort, your nurse or respiratory therapist will set it to a desired goal. If possible, sit up straight or lean slightly forward. Try not to slouch. Hold the incentive spirometer in an upright position. INSTRUCTIONS FOR USE  Sit on the edge of your bed if possible, or sit up as far as you can in bed or on a chair. Hold the incentive spirometer in an upright position. Breathe out normally. Place the mouthpiece in your mouth and seal your lips tightly around it. Breathe in slowly and as deeply as possible, raising the piston or the ball toward the top of the column. Hold your breath for 3-5 seconds or for as long as possible. Allow the piston or ball to fall to the bottom of the column. Remove the mouthpiece from your mouth and breathe out normally. Rest for a few seconds and repeat Steps 1 through 7 at least 10 times every 1-2  hours when you are awake. Take your time and take a few normal breaths between deep breaths. The spirometer may include an indicator to show your best effort. Use the indicator as a goal to work toward during each repetition. After each set of 10 deep breaths, practice coughing to be sure your lungs are clear. If you have an incision (the cut made at the time of surgery), support your incision when coughing by placing a pillow or rolled up towels firmly against it. Once you are able to get out of bed, walk around indoors and cough well. You may stop using the incentive spirometer when instructed by your caregiver.  RISKS AND COMPLICATIONS Take your time so you do not get dizzy or light-headed. If you are in pain, you may need to take or ask for pain medication before doing incentive spirometry. It is harder to take a deep breath if you are having pain. AFTER USE Rest and breathe slowly and easily. It can be helpful to keep track of a log of your progress. Your caregiver can provide you with a simple table to help with this. If you are using the spirometer at home, follow these instructions: [place] IF:  You are having difficultly using the spirometer. You have trouble using the spirometer as often as instructed. Your pain medication is not giving enough relief while using the spirometer. You develop fever of 100.5 F (38.1 C) or higher. SEEK IMMEDIATE MEDICAL CARE IF:  You cough up bloody sputum that had not been present before. You develop fever of 102 F (38.9 C) or greater. You develop worsening pain at or near the incision site. MAKE SURE YOU:  Understand these instructions. Will watch your condition. Will get help right away if you are not doing well or get worse. Document Released:  08/20/2006 Document Revised: 07/02/2011 Document Reviewed: 10/21/2006 ExitCare Patient Information 2014 [place],  [place].   ________________________________________________________________________

## 2022-06-21 ENCOUNTER — Encounter: Payer: Self-pay | Admitting: Radiology

## 2022-06-21 ENCOUNTER — Encounter (HOSPITAL_COMMUNITY)
Admission: RE | Admit: 2022-06-21 | Discharge: 2022-06-21 | Disposition: A | Discharge status: Home / Self Care | Payer: Medicare PPO | Source: Ambulatory Visit | Attending: Orthopedic Surgery | Admitting: Orthopedic Surgery

## 2022-06-21 ENCOUNTER — Encounter (HOSPITAL_COMMUNITY): Payer: Self-pay

## 2022-06-21 VITALS — BP 128/79 | HR 63 | Temp 97.5°F | Resp 16 | Ht 65.0 in | Wt 196.5 lb

## 2022-06-21 DIAGNOSIS — Z01818 Encounter for other preprocedural examination: Secondary | ICD-10-CM

## 2022-06-21 DIAGNOSIS — Z01812 Encounter for preprocedural laboratory examination: Secondary | ICD-10-CM | POA: Diagnosis not present

## 2022-06-21 DIAGNOSIS — R7303 Prediabetes: Secondary | ICD-10-CM | POA: Insufficient documentation

## 2022-06-21 DIAGNOSIS — I10 Essential (primary) hypertension: Secondary | ICD-10-CM | POA: Insufficient documentation

## 2022-06-21 HISTORY — DX: Pure hypercholesterolemia, unspecified: E78.00

## 2022-06-21 LAB — CBC
HCT: 40.8 % (ref 36.0–46.0)
Hemoglobin: 13 g/dL (ref 12.0–15.0)
MCH: 30.4 pg (ref 26.0–34.0)
MCHC: 31.9 g/dL (ref 30.0–36.0)
MCV: 95.6 fL (ref 80.0–100.0)
Platelets: 199 10*3/uL (ref 150–400)
RBC: 4.27 MIL/uL (ref 3.87–5.11)
RDW: 13.1 % (ref 11.5–15.5)
WBC: 7.3 10*3/uL (ref 4.0–10.5)
nRBC: 0 % (ref 0.0–0.2)

## 2022-06-21 LAB — BASIC METABOLIC PANEL
Anion gap: 8 (ref 5–15)
BUN: 21 mg/dL (ref 8–23)
CO2: 28 mmol/L (ref 22–32)
Calcium: 9.1 mg/dL (ref 8.9–10.3)
Chloride: 102 mmol/L (ref 98–111)
Creatinine, Ser: 0.91 mg/dL (ref 0.44–1.00)
GFR, Estimated: >60 mL/min (ref >60)
Glucose, Bld: 126 mg/dL — ABNORMAL HIGH (ref 70–99)
Potassium: 3.5 mmol/L (ref 3.5–5.1)
Sodium: 138 mmol/L (ref 135–145)

## 2022-06-21 LAB — SURGICAL PCR SCREEN
MRSA, PCR: NEGATIVE
Staphylococcus aureus: NEGATIVE

## 2022-06-21 LAB — GLUCOSE, CAPILLARY: Glucose-Capillary: 120 mg/dL — ABNORMAL HIGH (ref 70–99)

## 2022-06-22 LAB — HEMOGLOBIN A1C
Hgb A1c MFr Bld: 6 % — ABNORMAL HIGH (ref 4.8–5.6)
Mean Plasma Glucose: 126 mg/dL

## 2022-06-27 NOTE — Anesthesia Preprocedure Evaluation (Signed)
Anesthesia Evaluation  Patient identified by MRN, date of birth, ID band Patient awake    Reviewed: Allergy & Precautions, NPO status , Patient's Chart, lab work & pertinent test results  Airway Mallampati: II  TM Distance: >3 FB Neck ROM: Full    Dental  (+) Dental Advisory Given, Partial Upper, Partial Lower   Pulmonary former smoker   Pulmonary exam normal breath sounds clear to auscultation       Cardiovascular hypertension, Pt. on medications Normal cardiovascular exam Rhythm:Regular Rate:Normal     Neuro/Psych  Neuromuscular disease    GI/Hepatic Neg liver ROS,GERD  ,,  Endo/Other  Obesity   Renal/GU negative Renal ROS     Musculoskeletal  (+) Arthritis , Osteoarthritis,    Abdominal   Peds  Hematology negative hematology ROS (+) Plt 199k   Anesthesia Other Findings   Reproductive/Obstetrics                             Anesthesia Physical Anesthesia Plan  ASA: 3  Anesthesia Plan: Spinal   Post-op Pain Management: Regional block* and Tylenol PO (pre-op)*   Induction: Intravenous  PONV Risk Score and Plan: 2 and Midazolam, TIVA, Dexamethasone and Ondansetron  Airway Management Planned: Natural Airway and Simple Face Mask  Additional Equipment:   Intra-op Plan:   Post-operative Plan:   Informed Consent: I have reviewed the patients History and Physical, chart, labs and discussed the procedure including the risks, benefits and alternatives for the proposed anesthesia with the patient or authorized representative who has indicated his/her understanding and acceptance.     Dental advisory given  Plan Discussed with: CRNA  Anesthesia Plan Comments:        Anesthesia Quick Evaluation

## 2022-06-28 ENCOUNTER — Ambulatory Visit (HOSPITAL_BASED_OUTPATIENT_CLINIC_OR_DEPARTMENT_OTHER): Payer: Medicare PPO | Admitting: Anesthesiology

## 2022-06-28 ENCOUNTER — Observation Stay (HOSPITAL_COMMUNITY)
Admission: RE | Admit: 2022-06-28 | Discharge: 2022-06-29 | Disposition: A | Discharge status: Home / Self Care | Payer: Medicare PPO | Source: Ambulatory Visit | Attending: Orthopedic Surgery | Admitting: Orthopedic Surgery

## 2022-06-28 ENCOUNTER — Ambulatory Visit (HOSPITAL_COMMUNITY): Payer: Medicare PPO | Admitting: Physician Assistant

## 2022-06-28 ENCOUNTER — Encounter (HOSPITAL_COMMUNITY): Payer: Self-pay | Admitting: Orthopedic Surgery

## 2022-06-28 ENCOUNTER — Other Ambulatory Visit: Payer: Self-pay

## 2022-06-28 ENCOUNTER — Encounter (HOSPITAL_COMMUNITY)
Admission: RE | Disposition: A | Discharge status: Home / Self Care | Payer: Self-pay | Source: Ambulatory Visit | Attending: Orthopedic Surgery

## 2022-06-28 DIAGNOSIS — E669 Obesity, unspecified: Secondary | ICD-10-CM | POA: Diagnosis not present

## 2022-06-28 DIAGNOSIS — R7309 Other abnormal glucose: Secondary | ICD-10-CM | POA: Insufficient documentation

## 2022-06-28 DIAGNOSIS — I10 Essential (primary) hypertension: Secondary | ICD-10-CM | POA: Insufficient documentation

## 2022-06-28 DIAGNOSIS — Z87891 Personal history of nicotine dependence: Secondary | ICD-10-CM

## 2022-06-28 DIAGNOSIS — M1712 Unilateral primary osteoarthritis, left knee: Secondary | ICD-10-CM | POA: Diagnosis not present

## 2022-06-28 DIAGNOSIS — R7303 Prediabetes: Secondary | ICD-10-CM

## 2022-06-28 DIAGNOSIS — G8918 Other acute postprocedural pain: Secondary | ICD-10-CM | POA: Diagnosis not present

## 2022-06-28 DIAGNOSIS — Z6832 Body mass index (BMI) 32.0-32.9, adult: Secondary | ICD-10-CM | POA: Diagnosis not present

## 2022-06-28 DIAGNOSIS — Z96652 Presence of left artificial knee joint: Secondary | ICD-10-CM

## 2022-06-28 HISTORY — PX: TOTAL KNEE ARTHROPLASTY: SHX125

## 2022-06-28 LAB — GLUCOSE, CAPILLARY: Glucose-Capillary: 129 mg/dL — ABNORMAL HIGH (ref 70–99)

## 2022-06-28 SURGERY — ARTHROPLASTY, KNEE, TOTAL
Anesthesia: Spinal | Site: Knee | Laterality: Left

## 2022-06-28 MED ORDER — TRANEXAMIC ACID-NACL 1000-0.7 MG/100ML-% IV SOLN
1000.0000 mg | INTRAVENOUS | Status: AC
  Administered 2022-06-28: 1000 mg via INTRAVENOUS
  Filled 2022-06-28: qty 100

## 2022-06-28 MED ORDER — ONDANSETRON HCL 4 MG/2ML IJ SOLN
INTRAMUSCULAR | Status: AC
  Filled 2022-06-28: qty 2

## 2022-06-28 MED ORDER — METOCLOPRAMIDE HCL 5 MG/ML IJ SOLN: 5.0000 mg | Freq: Three times a day (TID) | INTRAMUSCULAR | Status: DC | PRN

## 2022-06-28 MED ORDER — GABAPENTIN 300 MG PO CAPS
600.0000 mg | ORAL_CAPSULE | Freq: Every day | ORAL | Status: DC
  Administered 2022-06-28: 600 mg via ORAL
  Filled 2022-06-28: qty 2

## 2022-06-28 MED ORDER — PHENYLEPHRINE HCL-NACL 20-0.9 MG/250ML-% IV SOLN
INTRAVENOUS | Status: DC | PRN
  Administered 2022-06-28: 30 ug/min via INTRAVENOUS

## 2022-06-28 MED ORDER — BUPIVACAINE-EPINEPHRINE (PF) 0.25% -1:200000 IJ SOLN
INTRAMUSCULAR | Status: DC | PRN
  Administered 2022-06-28: 30 mL via PERINEURAL

## 2022-06-28 MED ORDER — METOCLOPRAMIDE HCL 5 MG PO TABS: 5.0000 mg | ORAL_TABLET | Freq: Three times a day (TID) | ORAL | Status: DC | PRN

## 2022-06-28 MED ORDER — LACTATED RINGERS IV SOLN: INTRAVENOUS | Status: DC

## 2022-06-28 MED ORDER — SODIUM CHLORIDE (PF) 0.9 % IJ SOLN
INTRAMUSCULAR | Status: DC | PRN
  Administered 2022-06-28: 30 mL

## 2022-06-28 MED ORDER — PHENYLEPHRINE 80 MCG/ML (10ML) SYRINGE FOR IV PUSH (FOR BLOOD PRESSURE SUPPORT)
PREFILLED_SYRINGE | INTRAVENOUS | Status: AC
  Filled 2022-06-28: qty 10

## 2022-06-28 MED ORDER — PROPOFOL 1000 MG/100ML IV EMUL
INTRAVENOUS | Status: AC
  Filled 2022-06-28: qty 100

## 2022-06-28 MED ORDER — PROPOFOL 10 MG/ML IV BOLUS
INTRAVENOUS | Status: AC
  Filled 2022-06-28: qty 20

## 2022-06-28 MED ORDER — CEFAZOLIN SODIUM-DEXTROSE 2-4 GM/100ML-% IV SOLN
2.0000 g | INTRAVENOUS | Status: AC
  Administered 2022-06-28: 2 g via INTRAVENOUS
  Filled 2022-06-28: qty 100

## 2022-06-28 MED ORDER — HYDROCHLOROTHIAZIDE 25 MG PO TABS
25.0000 mg | ORAL_TABLET | Freq: Every day | ORAL | Status: DC
  Administered 2022-06-29: 25 mg via ORAL
  Filled 2022-06-28: qty 1

## 2022-06-28 MED ORDER — OXYCODONE HCL 5 MG PO TABS
10.0000 mg | ORAL_TABLET | ORAL | Status: DC | PRN
  Administered 2022-06-29 (×2): 10 mg via ORAL
  Filled 2022-06-28: qty 2

## 2022-06-28 MED ORDER — ACETAMINOPHEN 500 MG PO TABS
1000.0000 mg | ORAL_TABLET | Freq: Once | ORAL | Status: AC
  Administered 2022-06-28: 1000 mg via ORAL
  Filled 2022-06-28: qty 2

## 2022-06-28 MED ORDER — FENTANYL CITRATE (PF) 100 MCG/2ML IJ SOLN
INTRAMUSCULAR | Status: DC | PRN
  Administered 2022-06-28: 50 ug via INTRAVENOUS

## 2022-06-28 MED ORDER — PHENYLEPHRINE 80 MCG/ML (10ML) SYRINGE FOR IV PUSH (FOR BLOOD PRESSURE SUPPORT)
PREFILLED_SYRINGE | INTRAVENOUS | Status: DC | PRN
  Administered 2022-06-28: 80 ug via INTRAVENOUS

## 2022-06-28 MED ORDER — POVIDONE-IODINE 10 % EX SWAB: 2.0000 | Freq: Once | CUTANEOUS | Status: DC

## 2022-06-28 MED ORDER — MELOXICAM 15 MG PO TABS
7.5000 mg | ORAL_TABLET | Freq: Every day | ORAL | Status: DC
  Administered 2022-06-29: 7.5 mg via ORAL
  Filled 2022-06-28: qty 1

## 2022-06-28 MED ORDER — ONDANSETRON HCL 4 MG/2ML IJ SOLN: 4.0000 mg | Freq: Once | INTRAMUSCULAR | Status: DC | PRN

## 2022-06-28 MED ORDER — FENTANYL CITRATE (PF) 100 MCG/2ML IJ SOLN
INTRAMUSCULAR | Status: AC
  Filled 2022-06-28: qty 2

## 2022-06-28 MED ORDER — ATORVASTATIN CALCIUM 20 MG PO TABS
20.0000 mg | ORAL_TABLET | Freq: Every day | ORAL | Status: DC
  Administered 2022-06-29: 20 mg via ORAL
  Filled 2022-06-28: qty 1

## 2022-06-28 MED ORDER — BUPIVACAINE IN DEXTROSE 0.75-8.25 % IT SOLN
INTRATHECAL | Status: DC | PRN
  Administered 2022-06-28: 1.6 mL via INTRATHECAL

## 2022-06-28 MED ORDER — SODIUM CHLORIDE 0.9 % IR SOLN
Status: DC | PRN
  Administered 2022-06-28: 1

## 2022-06-28 MED ORDER — IRBESARTAN 150 MG PO TABS
300.0000 mg | ORAL_TABLET | Freq: Every day | ORAL | Status: DC
  Administered 2022-06-29: 300 mg via ORAL
  Filled 2022-06-28: qty 2

## 2022-06-28 MED ORDER — CEFAZOLIN SODIUM-DEXTROSE 2-4 GM/100ML-% IV SOLN
2.0000 g | Freq: Four times a day (QID) | INTRAVENOUS | Status: AC
  Administered 2022-06-28 (×2): 2 g via INTRAVENOUS
  Filled 2022-06-28 (×2): qty 100

## 2022-06-28 MED ORDER — MIDAZOLAM HCL 2 MG/2ML IJ SOLN
INTRAMUSCULAR | Status: DC | PRN
  Administered 2022-06-28: 2 mg via INTRAVENOUS

## 2022-06-28 MED ORDER — DEXAMETHASONE SODIUM PHOSPHATE 10 MG/ML IJ SOLN
INTRAMUSCULAR | Status: AC
  Filled 2022-06-28: qty 1

## 2022-06-28 MED ORDER — KETOROLAC TROMETHAMINE 30 MG/ML IJ SOLN
INTRAMUSCULAR | Status: AC
  Filled 2022-06-28: qty 1

## 2022-06-28 MED ORDER — ACETAMINOPHEN 500 MG PO TABS
1000.0000 mg | ORAL_TABLET | Freq: Four times a day (QID) | ORAL | Status: AC
  Administered 2022-06-28 – 2022-06-29 (×4): 1000 mg via ORAL
  Filled 2022-06-28 (×4): qty 2

## 2022-06-28 MED ORDER — BUPIVACAINE-EPINEPHRINE (PF) 0.25% -1:200000 IJ SOLN
INTRAMUSCULAR | Status: AC
  Filled 2022-06-28: qty 30

## 2022-06-28 MED ORDER — ONDANSETRON HCL 4 MG/2ML IJ SOLN
INTRAMUSCULAR | Status: DC | PRN
  Administered 2022-06-28: 4 mg via INTRAVENOUS

## 2022-06-28 MED ORDER — DIPHENHYDRAMINE HCL 12.5 MG/5ML PO ELIX: 12.5000 mg | ORAL_SOLUTION | ORAL | Status: DC | PRN

## 2022-06-28 MED ORDER — TRANEXAMIC ACID-NACL 1000-0.7 MG/100ML-% IV SOLN
1000.0000 mg | Freq: Once | INTRAVENOUS | Status: AC
  Administered 2022-06-28: 1000 mg via INTRAVENOUS

## 2022-06-28 MED ORDER — ONDANSETRON HCL 4 MG/2ML IJ SOLN: 4.0000 mg | Freq: Four times a day (QID) | INTRAMUSCULAR | Status: DC | PRN

## 2022-06-28 MED ORDER — MIDAZOLAM HCL 2 MG/2ML IJ SOLN
INTRAMUSCULAR | Status: AC
  Filled 2022-06-28: qty 2

## 2022-06-28 MED ORDER — OLMESARTAN MEDOXOMIL-HCTZ 40-25 MG PO TABS: 1.0000 | ORAL_TABLET | Freq: Every morning | ORAL | Status: DC

## 2022-06-28 MED ORDER — DEXAMETHASONE SODIUM PHOSPHATE 10 MG/ML IJ SOLN
8.0000 mg | Freq: Once | INTRAMUSCULAR | Status: AC
  Administered 2022-06-28: 8 mg via INTRAVENOUS

## 2022-06-28 MED ORDER — PROPOFOL 500 MG/50ML IV EMUL
INTRAVENOUS | Status: DC | PRN
  Administered 2022-06-28: 75 ug/kg/min via INTRAVENOUS

## 2022-06-28 MED ORDER — DEXAMETHASONE SODIUM PHOSPHATE 10 MG/ML IJ SOLN
10.0000 mg | Freq: Once | INTRAMUSCULAR | Status: AC
  Administered 2022-06-29: 10 mg via INTRAVENOUS
  Filled 2022-06-28: qty 1

## 2022-06-28 MED ORDER — EPHEDRINE SULFATE-NACL 50-0.9 MG/10ML-% IV SOSY
PREFILLED_SYRINGE | INTRAVENOUS | Status: DC | PRN
  Administered 2022-06-28: 10 mg via INTRAVENOUS

## 2022-06-28 MED ORDER — ASPIRIN 81 MG PO CHEW
81.0000 mg | CHEWABLE_TABLET | Freq: Two times a day (BID) | ORAL | Status: DC
  Administered 2022-06-28 – 2022-06-29 (×2): 81 mg via ORAL
  Filled 2022-06-28 (×2): qty 1

## 2022-06-28 MED ORDER — ONDANSETRON HCL 4 MG PO TABS: 4.0000 mg | ORAL_TABLET | Freq: Four times a day (QID) | ORAL | Status: DC | PRN

## 2022-06-28 MED ORDER — POLYETHYLENE GLYCOL 3350 17 G PO PACK
17.0000 g | PACK | Freq: Two times a day (BID) | ORAL | Status: DC
  Administered 2022-06-28 – 2022-06-29 (×3): 17 g via ORAL
  Filled 2022-06-28 (×3): qty 1

## 2022-06-28 MED ORDER — FENTANYL CITRATE PF 50 MCG/ML IJ SOSY: 25.0000 ug | PREFILLED_SYRINGE | INTRAMUSCULAR | Status: DC | PRN

## 2022-06-28 MED ORDER — 0.9 % SODIUM CHLORIDE (POUR BTL) OPTIME
TOPICAL | Status: DC | PRN
  Administered 2022-06-28: 1000 mL

## 2022-06-28 MED ORDER — ROPIVACAINE HCL 5 MG/ML IJ SOLN
INTRAMUSCULAR | Status: DC | PRN
  Administered 2022-06-28: 20 mL via PERINEURAL

## 2022-06-28 MED ORDER — ORAL CARE MOUTH RINSE: 15.0000 mL | Freq: Once | OROMUCOSAL | Status: AC

## 2022-06-28 MED ORDER — DOCUSATE SODIUM 100 MG PO CAPS
100.0000 mg | ORAL_CAPSULE | Freq: Two times a day (BID) | ORAL | Status: DC
  Administered 2022-06-28 – 2022-06-29 (×3): 100 mg via ORAL
  Filled 2022-06-28 (×3): qty 1

## 2022-06-28 MED ORDER — AMLODIPINE BESYLATE 5 MG PO TABS
5.0000 mg | ORAL_TABLET | Freq: Every day | ORAL | Status: DC
  Administered 2022-06-28: 5 mg via ORAL
  Filled 2022-06-28: qty 1

## 2022-06-28 MED ORDER — EPHEDRINE 5 MG/ML INJ
INTRAVENOUS | Status: AC
  Filled 2022-06-28: qty 5

## 2022-06-28 MED ORDER — PROPOFOL 10 MG/ML IV BOLUS
INTRAVENOUS | Status: DC | PRN
  Administered 2022-06-28: 10 mg via INTRAVENOUS
  Administered 2022-06-28: 20 mg via INTRAVENOUS

## 2022-06-28 MED ORDER — METHOCARBAMOL 1000 MG/10ML IJ SOLN: 500.0000 mg | Freq: Four times a day (QID) | INTRAVENOUS | Status: DC | PRN

## 2022-06-28 MED ORDER — ACETAMINOPHEN 325 MG PO TABS: 325.0000 mg | ORAL_TABLET | Freq: Four times a day (QID) | ORAL | Status: DC | PRN

## 2022-06-28 MED ORDER — SODIUM CHLORIDE 0.9 % IV SOLN: INTRAVENOUS | Status: DC

## 2022-06-28 MED ORDER — CHLORHEXIDINE GLUCONATE 0.12 % MT SOLN
15.0000 mL | Freq: Once | OROMUCOSAL | Status: AC
  Administered 2022-06-28: 15 mL via OROMUCOSAL

## 2022-06-28 MED ORDER — OXYCODONE HCL 5 MG PO TABS
5.0000 mg | ORAL_TABLET | ORAL | Status: DC | PRN
  Administered 2022-06-28: 5 mg via ORAL
  Administered 2022-06-29: 10 mg via ORAL
  Filled 2022-06-28 (×2): qty 2
  Filled 2022-06-28: qty 1
  Filled 2022-06-28: qty 2

## 2022-06-28 MED ORDER — DEXAMETHASONE SODIUM PHOSPHATE 10 MG/ML IJ SOLN
INTRAMUSCULAR | Status: DC | PRN
  Administered 2022-06-28: 10 mg

## 2022-06-28 MED ORDER — PHENOL 1.4 % MT LIQD: 1.0000 | OROMUCOSAL | Status: DC | PRN

## 2022-06-28 MED ORDER — STERILE WATER FOR IRRIGATION IR SOLN
Status: DC | PRN
  Administered 2022-06-28: 2000 mL

## 2022-06-28 MED ORDER — METHOCARBAMOL 500 MG PO TABS
500.0000 mg | ORAL_TABLET | Freq: Four times a day (QID) | ORAL | Status: DC | PRN
  Administered 2022-06-28 – 2022-06-29 (×3): 500 mg via ORAL
  Filled 2022-06-28 (×4): qty 1

## 2022-06-28 MED ORDER — TRANEXAMIC ACID-NACL 1000-0.7 MG/100ML-% IV SOLN
INTRAVENOUS | Status: AC
  Filled 2022-06-28: qty 100

## 2022-06-28 MED ORDER — BISACODYL 10 MG RE SUPP: 10.0000 mg | Freq: Every day | RECTAL | Status: DC | PRN

## 2022-06-28 MED ORDER — HYDROMORPHONE HCL 1 MG/ML IJ SOLN: 0.5000 mg | INTRAMUSCULAR | Status: DC | PRN

## 2022-06-28 MED ORDER — KETOROLAC TROMETHAMINE 30 MG/ML IJ SOLN
INTRAMUSCULAR | Status: DC | PRN
  Administered 2022-06-28: 30 mg via INTRAVENOUS

## 2022-06-28 MED ORDER — FOLIC ACID 1 MG PO TABS
1.0000 mg | ORAL_TABLET | Freq: Every morning | ORAL | Status: DC
  Administered 2022-06-29: 1 mg via ORAL
  Filled 2022-06-28: qty 1

## 2022-06-28 MED ORDER — MENTHOL 3 MG MT LOZG: 1.0000 | LOZENGE | OROMUCOSAL | Status: DC | PRN

## 2022-06-28 MED ORDER — SODIUM CHLORIDE (PF) 0.9 % IJ SOLN
INTRAMUSCULAR | Status: AC
  Filled 2022-06-28: qty 50

## 2022-06-28 SURGICAL SUPPLY — 57 items
ADH SKN CLS APL DERMABOND .7 (GAUZE/BANDAGES/DRESSINGS) ×1
ATTUNE MED ANAT PAT 35 KNEE (Knees) IMPLANT
BAG COUNTER SPONGE SURGICOUNT (BAG) IMPLANT
BAG SPEC THK2 15X12 ZIP CLS (MISCELLANEOUS)
BAG SPNG CNTER NS LX DISP (BAG)
BAG ZIPLOCK 12X15 (MISCELLANEOUS) IMPLANT
BASEPLATE TIB CMT FB PCKT SZ4 (Stem) IMPLANT
BLADE SAW SGTL 11.0X1.19X90.0M (BLADE) IMPLANT
BLADE SAW SGTL 13.0X1.19X90.0M (BLADE) ×1 IMPLANT
BNDG CMPR 5X62 HK CLSR LF (GAUZE/BANDAGES/DRESSINGS) ×1
BNDG ELASTIC 6INX 5YD STR LF (GAUZE/BANDAGES/DRESSINGS) ×1 IMPLANT
BNDG ELASTIC 6X5.8 VLCR STR LF (GAUZE/BANDAGES/DRESSINGS) IMPLANT
BOWL SMART MIX CTS (DISPOSABLE) ×1 IMPLANT
BSPLAT TIB 4 CMNT FX BRNG STRL (Stem) ×1 IMPLANT
CEMENT HV SMART SET (Cement) IMPLANT
COMP FEM CMT ATTUNE NRW 5 LT (Joint) ×1 IMPLANT
COMPONENT FEM CMT ATTN NRW 5LT (Joint) IMPLANT
COVER SURGICAL LIGHT HANDLE (MISCELLANEOUS) ×1 IMPLANT
CUFF TOURN SGL QUICK 34 (TOURNIQUET CUFF) ×1
CUFF TRNQT CYL 34X4.125X (TOURNIQUET CUFF) ×1 IMPLANT
DERMABOND ADVANCED .7 DNX12 (GAUZE/BANDAGES/DRESSINGS) ×1 IMPLANT
DRAPE U-SHAPE 47X51 STRL (DRAPES) ×1 IMPLANT
DRESSING AQUACEL AG SP 3.5X10 (GAUZE/BANDAGES/DRESSINGS) ×1 IMPLANT
DRSG AQUACEL AG ADV 3.5X10 (GAUZE/BANDAGES/DRESSINGS) IMPLANT
DRSG AQUACEL AG SP 3.5X10 (GAUZE/BANDAGES/DRESSINGS) ×1
DURAPREP 26ML APPLICATOR (WOUND CARE) ×2 IMPLANT
ELECT REM PT RETURN 15FT ADLT (MISCELLANEOUS) ×1 IMPLANT
GLOVE BIO SURGEON STRL SZ 6 (GLOVE) ×1 IMPLANT
GLOVE BIOGEL PI IND STRL 6.5 (GLOVE) ×1 IMPLANT
GLOVE BIOGEL PI IND STRL 7.5 (GLOVE) ×1 IMPLANT
GLOVE ORTHO TXT STRL SZ7.5 (GLOVE) ×2 IMPLANT
GOWN STRL REUS W/ TWL LRG LVL3 (GOWN DISPOSABLE) ×2 IMPLANT
GOWN STRL REUS W/TWL LRG LVL3 (GOWN DISPOSABLE) ×2
HANDPIECE INTERPULSE COAX TIP (DISPOSABLE) ×1
HOLDER FOLEY CATH W/STRAP (MISCELLANEOUS) IMPLANT
INSERT MED CMT ATTUNE 5X5 LT (Insert) IMPLANT
KIT TURNOVER KIT A (KITS) IMPLANT
MANIFOLD NEPTUNE II (INSTRUMENTS) ×1 IMPLANT
NDL SAFETY ECLIP 18X1.5 (MISCELLANEOUS) IMPLANT
NS IRRIG 1000ML POUR BTL (IV SOLUTION) ×1 IMPLANT
PACK TOTAL KNEE CUSTOM (KITS) ×1 IMPLANT
PIN FIX SIGMA LCS THRD HI (PIN) IMPLANT
PROTECTOR NERVE ULNAR (MISCELLANEOUS) ×1 IMPLANT
SET HNDPC FAN SPRY TIP SCT (DISPOSABLE) ×1 IMPLANT
SET PAD KNEE POSITIONER (MISCELLANEOUS) ×1 IMPLANT
SPIKE FLUID TRANSFER (MISCELLANEOUS) ×2 IMPLANT
SUT MNCRL AB 4-0 PS2 18 (SUTURE) ×1 IMPLANT
SUT STRATAFIX PDS+ 0 24IN (SUTURE) ×1 IMPLANT
SUT VIC AB 1 CT1 36 (SUTURE) ×1 IMPLANT
SUT VIC AB 2-0 CT1 27 (SUTURE) ×2
SUT VIC AB 2-0 CT1 TAPERPNT 27 (SUTURE) ×2 IMPLANT
SYR 3ML LL SCALE MARK (SYRINGE) ×1 IMPLANT
TOWEL GREEN STERILE FF (TOWEL DISPOSABLE) ×1 IMPLANT
TRAY FOLEY MTR SLVR 16FR STAT (SET/KITS/TRAYS/PACK) ×1 IMPLANT
TUBE SUCTION HIGH CAP CLEAR NV (SUCTIONS) ×1 IMPLANT
WATER STERILE IRR 1000ML POUR (IV SOLUTION) ×2 IMPLANT
WRAP KNEE MAXI GEL POST OP (GAUZE/BANDAGES/DRESSINGS) ×1 IMPLANT

## 2022-06-28 NOTE — Anesthesia Postprocedure Evaluation (Signed)
Anesthesia Post Note  Patient: [name]  Procedure(s) Performed: TOTAL KNEE ARTHROPLASTY (Left: Knee)     Patient location during evaluation: PACU Anesthesia Type: Spinal Level of consciousness: awake, awake and alert and oriented Pain management: pain level controlled Vital Signs Assessment: post-procedure vital signs reviewed and stable Respiratory status: spontaneous breathing, nonlabored ventilation and respiratory function stable Cardiovascular status: blood pressure returned to baseline and stable Postop Assessment: no headache, no backache, spinal receding and no apparent nausea or vomiting Anesthetic complications: no   No notable events documented.  Last Vitals:  Vitals:   06/28/22 1305 06/28/22 1538  BP: (!) 142/68 126/67  Pulse: 78 86  Resp: 20 18  Temp: 36.5 C 36.5 C  SpO2: 95% 95%    Last Pain:  Vitals:   06/28/22 1526  TempSrc:   PainSc: [place]

## 2022-06-28 NOTE — Discharge Instructions (Addendum)

## 2022-06-28 NOTE — Op Note (Signed)
NAME:  [place] RECORD NO.:  [id number]                             FACILITY:  [facility]      PHYSICIAN:  Pietro Cassis. Alvan Dame, M.D.  DATE OF BIRTH:  02-07-1956      DATE OF PROCEDURE:  06/28/2022                                     OPERATIVE REPORT         PREOPERATIVE DIAGNOSIS:  Left knee osteoarthritis.      POSTOPERATIVE DIAGNOSIS:  Left knee osteoarthritis.      FINDINGS:  The patient was noted to have complete loss of cartilage and   bone-on-bone arthritis with associated osteophytes in the lateral and patellofemoral compartments of   the knee with a significant synovitis and associated effusion.  The patient had failed months of conservative treatment including medications, injection therapy, activity modification.     PROCEDURE:  Left total knee replacement.      COMPONENTS USED:  DePuy Attune MS CR fixe bearing knee   system, a size 5N femur, 4 tibia, size 5 mm MS CR AOX insert, and 35 anatomic patellar   button.      SURGEON:  Pietro Cassis. Alvan Dame, M.D.      ASSISTANT:  Costella Hatcher, PA-C.      ANESTHESIA:  Regional and Spinal.      SPECIMENS:  None.      COMPLICATION:  None.      DRAINS:  None.  EBL: <100 cc      TOURNIQUET TIME:   Total Tourniquet Time Documented: Thigh (Left) - 28 minutes Total: Thigh (Left) - 28 minutes  .      The patient was stable to the recovery room.      INDICATION FOR PROCEDURE:  [name] is a 67 y.o. female patient of   mine.  The patient had been seen, evaluated, and treated for months conservatively in the   office with medication, activity modification, and injections.  The patient had   radiographic changes of bone-on-bone arthritis with endplate sclerosis and osteophytes noted.  Based on the radiographic changes and failed conservative measures, the patient   decided to proceed with definitive treatment, total knee replacement.  Risks of infection, DVT, component failure, need for revision  surgery, neurovascular injury were reviewed in the office setting.  The postop course was reviewed stressing the efforts to maximize post-operative satisfaction and function.  Consent was obtained for benefit of pain   relief.      PROCEDURE IN DETAIL:  The patient was brought to the operative theater.   Once adequate anesthesia, preoperative antibiotics, 2 gm of Ancef,1 gm of Tranexamic Acid, and 10 mg of Decadron administered, the patient was positioned supine with a left thigh tourniquet placed.  The  left lower extremity was prepped and draped in sterile fashion.  A time-   out was performed identifying the patient, planned procedure, and the appropriate extremity.      The left lower extremity was placed in the [facility] leg holder.  The leg was   exsanguinated, tourniquet elevated to 225 mmHg.  A midline incision was  made followed by median parapatellar arthrotomy.  Following initial   exposure, attention was first directed to the patella.  Precut   measurement was noted to be 23 mm.  I resected down to 13 mm and used a   35 anatomic patellar button to restore patellar height as well as cover the cut surface.      The lug holes were drilled and a metal shim was placed to protect the   patella from retractors and saw blade during the procedure.      At this point, attention was now directed to the femur.  The femoral   canal was opened with a drill, irrigated to try to prevent fat emboli.  An   intramedullary rod was passed at 3 degrees valgus, 9 mm of bone was   resected off the distal femur.  Following this resection, the tibia was   subluxated anteriorly.  Using the extramedullary guide, 2 mm of bone was resected off   the proximal lateral tibia.  We confirmed the gap would be   stable medially and laterally with a size 5 spacer block as well as confirmed that the tibial cut was perpendicular in the coronal plane, checking with an alignment rod.      Once this was done, I sized the  femur to be a size 5 in the anterior-   posterior dimension, chose a narrow component based on medial and   lateral dimension.  The size 5 rotation block was then pinned in   position anterior referenced using the C-clamp to set rotation.  The   anterior, posterior, and  chamfer cuts were made without difficulty nor   notching making certain that I was along the anterior cortex to help   with flexion gap stability.      The final shim cut was made off the lateral aspect of distal femur.      At this point, the tibia was sized to be a size 4.  The size 4 tray was   then pinned in position after setting rotation by floating a tibial trial try with a trial reduction, then drilled and keel punched.  Trial reduction was now carried with a 5 femur,  4 tibia, a size 5 mm CR insert, and the 35 anatomic patella [place].  The knee was brought to full extension with good flexion stability with the patella   tracking through the trochlea without application of pressure.  Given   all these findings the trial components removed.  Final components were   opened and cement was mixed.  The knee was irrigated with normal saline solution and pulse lavage.  The synovial lining was   then injected with 30 cc of 0.25% Marcaine with epinephrine, 1 cc of Toradol and 30 cc of NS for a total of 61 cc.     Final implants were then cemented onto cleaned and dried cut surfaces of bone with the knee brought to extension with a size 5 mm MS CR trial insert.      Once the cement had fully cured, excess cement was removed   throughout the knee.  I confirmed that I was satisfied with the range of   motion and stability, and the final size 5 mm MS CR AOX insert was chosen.  It was   placed into the knee.      The tourniquet had been let down at 28 minutes.  No significant   hemostasis was required.  The extensor  mechanism was then reapproximated using #1 Vicryl and #1 Stratafix sutures with the knee   in flexion.  The    remaining wound was closed with 2-0 Vicryl and running 4-0 Monocryl.   The knee was cleaned, dried, dressed sterilely using Dermabond and   Aquacel dressing.  The patient was then   brought to recovery room in stable condition, tolerating the procedure   well.   Please note that Physician Assistant, Costella Hatcher, PA-C was present for the entirety of the case, and was utilized for pre-operative positioning, peri-operative retractor management, general facilitation of the procedure and for primary wound closure at the end of the case.              Pietro Cassis Alvan Dame, M.D.    06/28/2022 8:41 AM

## 2022-06-28 NOTE — Evaluation (Signed)
Physical Therapy Evaluation Patient Details Name: [name] MRN: [id number] DOB: [birth year] Today's Date: 06/28/2022  History of Present Illness  Pt s/p L TKR and with hx of htn and pre-diabeties  Clinical Impression  Pt s/p L TKR and presents with decreased L LE strength/ROM and post op pain limiting functional mobility.  Pt should progress well to dc home with family assist and reports first OP PT scheduled for 07/02/22.     Recommendations for follow up therapy are one component of a multi-disciplinary discharge planning process, led by the attending physician.  Recommendations may be updated based on patient status, additional functional criteria and insurance authorization.  Follow Up Recommendations Follow physician's recommendations for discharge plan and follow up therapies      Assistance Recommended at Discharge Frequent or constant Supervision/Assistance  Patient can return home with the following  A little help with walking and/or transfers;A little help with bathing/dressing/bathroom;Assist for transportation;Help with stairs or ramp for entrance;Assistance with cooking/housework    Equipment Recommendations Rolling walker (2 wheels)  Recommendations for Other Services       Functional Status Assessment Patient has had a recent decline in their functional status and demonstrates the ability to make significant improvements in function in a reasonable and predictable amount of time.     Precautions / Restrictions Precautions Precautions: Knee;Fall Restrictions Weight Bearing Restrictions: No Other Position/Activity Restrictions: WBAT      Mobility  Bed Mobility Overal bed mobility: Needs Assistance Bed Mobility: Supine to Sit     Supine to sit: Min assist     General bed mobility comments: cues for sequence and use of R LE to self assist    Transfers Overall transfer level: Needs assistance Equipment used: Rolling walker (2 wheels) Transfers: Sit  to/from Stand Sit to Stand: Min assist           General transfer comment: cues for LE management and use of UEs to self assist    Ambulation/Gait Ambulation/Gait assistance: Min assist Gait Distance (Feet): 38 Feet Assistive device: Rolling walker (2 wheels) Gait Pattern/deviations: Step-to pattern, Decreased step length - right, Decreased step length - left, Shuffle, Trunk flexed Gait velocity: decr     General Gait Details: cues for sequence, posture and position from [employer]            Wheelchair Mobility    Modified Rankin (Stroke Patients Only)       Balance Overall balance assessment: Needs assistance Sitting-balance support: Feet supported, No upper extremity supported Sitting balance-Leahy Scale: Good     Standing balance support: Bilateral upper extremity supported Standing balance-Leahy Scale: Poor                               Pertinent Vitals/Pain Pain Assessment Pain Assessment: 0-10 Pain Score: 4  Pain Location: L knee Pain Descriptors / Indicators: Aching, Sore Pain Intervention(s): Limited activity within patient's tolerance, Monitored during session, Premedicated before session, Ice applied    Home Living Family/patient expects to be discharged to:: Private residence Living Arrangements: Children Available Help at Discharge: Family Type of Home: House Home Access: Stairs to enter Entrance Stairs-Rails: None Technical brewer of Steps: 2   Home Layout: One level Home Equipment: None      Prior Function Prior Level of Function : Independent/Modified Independent             Mobility Comments: I hobbled  Hand Dominance        Extremity/Trunk Assessment   Upper Extremity Assessment Upper Extremity Assessment: Overall WFL for tasks assessed    Lower Extremity Assessment Lower Extremity Assessment: LLE deficits/detail    Cervical / Trunk Assessment Cervical / Trunk Assessment: Normal   Communication   Communication: No difficulties  Cognition Arousal/Alertness: Awake/alert Behavior During Therapy: WFL for tasks assessed/performed Overall Cognitive Status: Within Functional Limits for tasks assessed                                          General Comments      Exercises Total Joint Exercises Ankle Circles/Pumps: AROM, Both, 15 reps, Supine Straight Leg Raises: AAROM, AROM, Left, 5 reps, Supine   Assessment/Plan    PT Assessment Patient needs continued PT services  PT Problem List Decreased strength;Decreased range of motion;Decreased activity tolerance;Decreased balance;Decreased mobility;Decreased knowledge of use of DME;Pain       PT Treatment Interventions DME instruction;Gait training;Stair training;Functional mobility training;Therapeutic exercise;Therapeutic activities;Patient/family education    PT Goals (Current goals can be found in the Care Plan section)  Acute Rehab PT Goals Patient Stated Goal: Regain IND PT Goal Formulation: With patient Time For Goal Achievement: 07/05/22 Potential to Achieve Goals: Good    Frequency 7X/week     Co-evaluation               AM-PAC PT "6 Clicks" Mobility  Outcome Measure Help needed turning from your back to your side while in a flat bed without using bedrails?: A Little Help needed moving from lying on your back to sitting on the side of a flat bed without using bedrails?: A Little Help needed moving to and from a bed to a chair (including a wheelchair)?: A Little Help needed standing up from a chair using your arms (e.g., wheelchair or bedside chair)?: A Little Help needed to walk in hospital room?: A Little Help needed climbing 3-5 steps with a railing? : A Lot 6 Click Score: 17    End of Session Equipment Utilized During Treatment: Gait belt Activity Tolerance: Patient tolerated treatment well Patient left: in chair;with call bell/phone within reach;with chair alarm  set Nurse Communication: Mobility status PT Visit Diagnosis: Difficulty in walking, not elsewhere classified (R26.2)    Time: [id number] PT Time Calculation (min) (ACUTE ONLY): 26 min   Charges:   PT Evaluation $PT Eval Low Complexity: 1 Low PT Treatments $Gait Training: 8-22 mins        [place] Pager [phone] Office [phone]   Gwynneth Fabio 06/28/2022, 2:43 PM

## 2022-06-28 NOTE — Anesthesia Procedure Notes (Signed)
Procedure Name: MAC Date/Time: 06/28/2022 7:26 AM  Performed by: Niel Hummer, CRNAPre-anesthesia Checklist: Patient identified, Emergency Drugs available, Suction available and Patient being monitored Oxygen Delivery Method: Simple face mask

## 2022-06-28 NOTE — Transfer of Care (Signed)
Immediate Anesthesia Transfer of Care Note  Patient: [name]  Procedure(s) Performed: TOTAL KNEE ARTHROPLASTY (Left: Knee)  Patient Location: PACU  Anesthesia Type:Spinal  Level of Consciousness: awake, alert , and oriented  Airway & Oxygen Therapy: Patient Spontanous Breathing and Patient connected to face mask oxygen  Post-op Assessment: Report given to RN and Post -op Vital signs reviewed and stable  Post vital signs: Reviewed and stable  Last Vitals:  Vitals Value Taken Time  BP 97/54   Temp    Pulse 71 06/28/22 0902  Resp 9 06/28/22 0902  SpO2 100 % 06/28/22 0902  Vitals shown include unvalidated device data.  Last Pain:  Vitals:   06/28/22 0555  TempSrc: Oral  PainSc:       Patients Stated Pain Goal: 6 ([id number])  Complications: No notable events documented.

## 2022-06-28 NOTE — H&P (Signed)
TOTAL KNEE ADMISSION H&P  Patient is being admitted for left total knee arthroplasty.  Subjective:  Chief Complaint:left knee pain.  HPI: [name], 67 y.o. female, has a history of pain and functional disability in the left knee due to arthritis and has failed non-surgical conservative treatments for greater than 12 weeks to includeNSAID's and/or analgesics and activity modification.  Onset of symptoms was gradual, starting 2 years ago with gradually worsening course since that time. The patient noted no past surgery on the left knee(s).  Patient currently rates pain in the left knee(s) at 8 out of 10 with activity. Patient has worsening of pain with activity and weight bearing, pain that interferes with activities of daily living, and pain with passive range of motion.  Patient has evidence of joint space narrowing by imaging studies. There is no active infection.  Patient Active Problem List   Diagnosis Date Noted   Medication monitoring encounter 01/19/2022   Pyogenic inflammation of bone ([place]) 01/19/2022   Prediabetes 01/19/2022   Neuropathy 01/19/2022   Change in bowel habits 07/09/2018   History of colonic polyps 07/09/2018   Past Medical History:  Diagnosis Date   Arthritis    GERD (gastroesophageal reflux disease)    Hypercholesteremia    Hypertension    Neuropathy    Osteomyelitis (HCC)    left 2nd toe   Pneumonia    as a child   Pre-diabetes     Past Surgical History:  Procedure Laterality Date   ABDOMINAL HYSTERECTOMY     AMPUTATION TOE Left 01/11/2022   Procedure: Left 2nd toe partial amputation;  Surgeon: Armond Hang, MD;  Location: [facility];  Service: Orthopedics;  Laterality: Left;  60 min   BIOPSY  03/13/2018   Procedure: BIOPSY;  Surgeon: Daneil Dolin, MD;  [facility];  Service: Endoscopy;;  (colon)   BLADDER SUSPENSION     COLONOSCOPY WITH PROPOFOL N/A 03/13/2018   Procedure: COLONOSCOPY WITH PROPOFOL;  Surgeon:  Daneil Dolin, MD;  Location: [facility];  Service: Endoscopy;  Laterality: N/A;  1:00pm-rescheduled to 11/21 @ 1:30pm per [place]    Current Facility-Administered Medications  Medication Dose Route Frequency Provider Last Rate Last Admin   ceFAZolin (ANCEF) IVPB 2g/100 mL premix  2 g Intravenous On Call to OR Irving Copas, PA-C       dexamethasone (DECADRON) injection 8 mg  8 mg Intravenous Once Irving Copas, PA-C       lactated ringers infusion   Intravenous Continuous Irving Copas, PA-C       lactated ringers infusion   Intravenous Continuous Freddrick March, MD 10 mL/hr at 06/28/22 0556 New Bag at 06/28/22 0556   povidone-iodine 10 % swab 2 Application  2 Application Topical Once Irving Copas, PA-C       tranexamic acid (CYKLOKAPRON) IVPB 1,000 mg  1,000 mg Intravenous To OR Irving Copas, PA-C       Allergies  Allergen Reactions   Wound Dressing Adhesive Rash    Social History   Tobacco Use   Smoking status: Former   Smokeless tobacco: Never  Substance Use Topics   Alcohol use: Never    History reviewed. No pertinent family history.   Review of Systems  Constitutional:  Negative for chills and fever.  Respiratory:  Negative for cough and shortness of breath.   Cardiovascular:  Negative for chest pain.  Gastrointestinal:  Negative for nausea and vomiting.  Musculoskeletal:  Positive for arthralgias.     Objective:  Physical Exam Well nourished and well developed. General: Alert and oriented x3, cooperative and pleasant, no acute distress. Head: normocephalic, atraumatic, neck supple. Eyes: EOMI.  Musculoskeletal: Left knee exam: No palpable effusion, warmth erythema Slight flexion contracture with flexion of 110 degrees Tenderness over the medial and anterior aspect knee with crepitation noted with flexion No lower extremity edema or erythema   Calves soft and nontender. Motor function intact in LE. Strength 5/5 LE  bilaterally. Neuro: Distal pulses 2+. Sensation to light touch intact in LE.  Vital signs in last 24 hours: Temp:  [98.1 F (36.7 C)] 98.1 F (36.7 C) (03/07 0555) Pulse Rate:  [75] 75 (03/07 0555) Resp:  [18] 18 (03/07 0555) BP: (137)/(86) 137/86 (03/07 0555) SpO2:  [97 %] 97 % (03/07 0555) Weight:  [89.1 kg] 89.1 kg (03/07 0549)  Labs:   Estimated body mass index is 32.7 kg/m as calculated from the following:   Height as of this encounter: '5\' 5"'$  (1.651 m).   Weight as of this encounter: 89.1 kg.   Imaging Review Plain radiographs demonstrate severe degenerative joint disease of the left knee(s). The overall alignment isneutral. The bone quality appears to be adequate for age and reported activity level.      Assessment/Plan:  End stage arthritis, left knee   The patient history, physical examination, clinical judgment of the provider and imaging studies are consistent with end stage degenerative joint disease of the left knee(s) and total knee arthroplasty is deemed medically necessary. The treatment options including medical management, injection therapy arthroscopy and arthroplasty were discussed at length. The risks and benefits of total knee arthroplasty were presented and reviewed. The risks due to aseptic loosening, infection, stiffness, patella tracking problems, thromboembolic complications and other imponderables were discussed. The patient acknowledged the explanation, agreed to proceed with the plan and consent was signed. Patient is being admitted for inpatient treatment for surgery, pain control, PT, OT, prophylactic antibiotics, VTE prophylaxis, progressive ambulation and ADL's and discharge planning. The patient is planning to be discharged  home.  Therapy Plans: outpatient therapy at [facility] Disposition: Home with son (lives with her) Planned DVT Prophylaxis: aspirin '81mg'$  BID DME needed: walker PCP: Dr. Hilma Favors, clearance received TXA: IV Allergies:  NKDA Anesthesia Concerns: none BMI: 33.3 Last HgbA1c: 6.0% - pre-diabetic  Other: - oxycodone, robaxin, tylenol, meloxicam - No hx of VTE or cancer - ?cellulitis/ osteomyelitis resulting in toe amputation last year -- ? cefadroxil    Patient's anticipated LOS is less than 2 midnights, meeting these requirements: - Younger than 37 - Lives within 1 hour of care - Has a competent adult at home to recover with post-op recover - NO history of  - Chronic pain requiring opiods  - Diabetes  - Coronary Artery Disease  - Heart failure  - Heart attack  - Stroke  - DVT/VTE  - Cardiac arrhythmia  - Respiratory Failure/COPD  - Renal failure  - Anemia  - Advanced Liver disease  Costella Hatcher, PA-C Orthopedic Surgery [facility] [place] [phone]

## 2022-06-28 NOTE — Anesthesia Procedure Notes (Signed)
Spinal  Patient location during procedure: OR Start time: 06/28/2022 7:28 AM End time: 06/28/2022 7:33 AM Reason for block: surgical anesthesia Staffing Performed: anesthesiologist  Anesthesiologist: Santa Lighter, MD Performed by: Santa Lighter, MD Authorized by: Santa Lighter, MD   Preanesthetic Checklist Completed: patient identified, IV checked, risks and benefits discussed, surgical consent, monitors and equipment checked, pre-op evaluation and timeout performed Spinal Block Patient position: sitting Prep: DuraPrep and site prepped and draped Patient monitoring: continuous pulse ox and blood pressure Approach: midline Location: L3-4 Injection technique: single-shot Needle Needle type: Pencan  Needle gauge: 24 G Assessment Events: CSF return Additional Notes Functioning IV was confirmed and monitors were applied. Sterile prep and drape, including hand hygiene, mask and sterile gloves were used. The patient was positioned and the spine was prepped. The skin was anesthetized with lidocaine.  Free flow of clear CSF was obtained prior to injecting local anesthetic into the CSF.  The spinal needle aspirated freely following injection.  The needle was carefully withdrawn.  The patient tolerated the procedure well. Consent was obtained prior to procedure with all questions answered and concerns addressed. Risks including but not limited to bleeding, infection, nerve damage, paralysis, failed block, inadequate analgesia, allergic reaction, high spinal, itching and headache were discussed and the patient wished to proceed.   Hoy Morn, MD

## 2022-06-28 NOTE — Plan of Care (Signed)
Problem: Education: Goal: Knowledge of the prescribed therapeutic regimen will improve Outcome: Progressing   Problem: Activity: Goal: Ability to avoid complications of mobility impairment will improve Outcome: Progressing   Problem: Clinical Measurements: Goal: Postoperative complications will be avoided or minimized Outcome: Progressing   Problem: Pain Management: Goal: Pain level will decrease with appropriate interventions Outcome: [place], RN 06/28/22 3:59 PM

## 2022-06-28 NOTE — Anesthesia Procedure Notes (Signed)
Anesthesia Regional Block: Adductor canal block   Pre-Anesthetic Checklist: , timeout performed,  Correct Patient, Correct Site, Correct Laterality,  Correct Procedure, Correct Position, site marked,  Risks and benefits discussed,  Surgical consent,  Pre-op evaluation,  At surgeon's request and post-op pain management  Laterality: Left  Prep: chloraprep       Needles:  Injection technique: Single-shot  Needle Type: Echogenic Needle     Needle Length: 9cm  Needle Gauge: 21     Additional Needles:   Procedures:,,,, ultrasound used (permanent image in chart),,    Narrative:  Start time: 06/28/2022 6:48 AM End time: 06/28/2022 6:55 AM Injection made incrementally with aspirations every 5 mL.  Performed by: Personally  Anesthesiologist: Santa Lighter, MD  Additional Notes: No pain on injection. No increased resistance to injection. Injection made in 5cc increments.  Good needle visualization.  Patient tolerated procedure well.

## 2022-06-28 NOTE — Care Plan (Signed)
Ortho Bundle Case Management Note  Patient Details  Name: [name] MRN: [id number] Date of Birth: July 07, 1955                  L TKA on 06/28/22.  DCP: Home with son.  DME: RW ordered through [facility].  PT: [name] 3/11   DME Arranged:  Gilford Rile rolling DME Agency:  [employer]    Additional Comments: Please contact me with any questions of if this plan should need to change.    Dario Ave, Case Manager  [facility]  [phone] 06/28/2022, 1:04 PM

## 2022-06-29 ENCOUNTER — Encounter (HOSPITAL_COMMUNITY): Payer: Self-pay | Admitting: Orthopedic Surgery

## 2022-06-29 DIAGNOSIS — R7309 Other abnormal glucose: Secondary | ICD-10-CM | POA: Diagnosis not present

## 2022-06-29 DIAGNOSIS — Z87891 Personal history of nicotine dependence: Secondary | ICD-10-CM | POA: Diagnosis not present

## 2022-06-29 DIAGNOSIS — I10 Essential (primary) hypertension: Secondary | ICD-10-CM | POA: Diagnosis not present

## 2022-06-29 DIAGNOSIS — M1712 Unilateral primary osteoarthritis, left knee: Secondary | ICD-10-CM | POA: Diagnosis not present

## 2022-06-29 LAB — BASIC METABOLIC PANEL
Anion gap: 12 (ref 5–15)
BUN: 23 mg/dL (ref 8–23)
CO2: 24 mmol/L (ref 22–32)
Calcium: 8.9 mg/dL (ref 8.9–10.3)
Chloride: 104 mmol/L (ref 98–111)
Creatinine, Ser: 0.86 mg/dL (ref 0.44–1.00)
GFR, Estimated: >60 mL/min (ref >60)
Glucose, Bld: 148 mg/dL — ABNORMAL HIGH (ref 70–99)
Potassium: 3.9 mmol/L (ref 3.5–5.1)
Sodium: 140 mmol/L (ref 135–145)

## 2022-06-29 LAB — CBC
HCT: 35.9 % — ABNORMAL LOW (ref 36.0–46.0)
Hemoglobin: 11.5 g/dL — ABNORMAL LOW (ref 12.0–15.0)
MCH: 30.5 pg (ref 26.0–34.0)
MCHC: 32 g/dL (ref 30.0–36.0)
MCV: 95.2 fL (ref 80.0–100.0)
Platelets: 200 10*3/uL (ref 150–400)
RBC: 3.77 MIL/uL — ABNORMAL LOW (ref 3.87–5.11)
RDW: 12.9 % (ref 11.5–15.5)
WBC: 11.1 10*3/uL — ABNORMAL HIGH (ref 4.0–10.5)
nRBC: 0 % (ref 0.0–0.2)

## 2022-06-29 MED ORDER — ACETAMINOPHEN 500 MG PO TABS: 1000.0000 mg | ORAL_TABLET | Freq: Four times a day (QID) | ORAL | 0 refills | Status: DC

## 2022-06-29 MED ORDER — METHOCARBAMOL 500 MG PO TABS: 500.0000 mg | ORAL_TABLET | Freq: Four times a day (QID) | ORAL | 2 refills | Status: DC | PRN

## 2022-06-29 MED ORDER — ASPIRIN 81 MG PO CHEW: 81.0000 mg | CHEWABLE_TABLET | Freq: Two times a day (BID) | ORAL | 0 refills | Status: AC

## 2022-06-29 MED ORDER — TRANEXAMIC ACID 650 MG PO TABS: 1950.0000 mg | ORAL_TABLET | Freq: Every day | ORAL | 0 refills | Status: AC

## 2022-06-29 MED ORDER — POLYETHYLENE GLYCOL 3350 17 G PO PACK: 17.0000 g | PACK | Freq: Two times a day (BID) | ORAL | 0 refills | Status: DC

## 2022-06-29 MED ORDER — OXYCODONE HCL 5 MG PO TABS: 5.0000 mg | ORAL_TABLET | ORAL | 0 refills | Status: DC | PRN

## 2022-06-29 MED ORDER — TRANEXAMIC ACID 650 MG PO TABS
1950.0000 mg | ORAL_TABLET | Freq: Every day | ORAL | Status: DC
  Administered 2022-06-29: 1950 mg via ORAL
  Filled 2022-06-29: qty 3

## 2022-06-29 NOTE — Progress Notes (Signed)
Physical Therapy Treatment Patient Details Name: [name] MRN: [id number] DOB: Jan 06, 1956 Today's Date: 06/29/2022   History of Present Illness Pt s/p L TKR and with hx of htn and pre-diabeties    PT Comments    Pt in good spirits this am and progressing well with mobility.  Pt up to ambulate increased distance in hall and performed HEP.  Will follow up with stair training and written HEP.  Pt eager for dc home later this date.   Recommendations for follow up therapy are one component of a multi-disciplinary discharge planning process, led by the attending physician.  Recommendations may be updated based on patient status, additional functional criteria and insurance authorization.  Follow Up Recommendations  Follow physician's recommendations for discharge plan and follow up therapies     Assistance Recommended at Discharge Frequent or constant Supervision/Assistance  Patient can return home with the following A little help with walking and/or transfers;A little help with bathing/dressing/bathroom;Assist for transportation;Help with stairs or ramp for entrance;Assistance with cooking/housework   Equipment Recommendations  Rolling walker (2 wheels)    Recommendations for Other Services       Precautions / Restrictions Precautions Precautions: Knee;Fall Restrictions Weight Bearing Restrictions: No LLE Weight Bearing: Weight bearing as tolerated Other Position/Activity Restrictions: WBAT     Mobility  Bed Mobility Overal bed mobility: Needs Assistance Bed Mobility: Supine to Sit     Supine to sit: Min guard, Supervision     General bed mobility comments: cues for sequence and use of R LE to self assist    Transfers Overall transfer level: Needs assistance Equipment used: Rolling walker (2 wheels) Transfers: Sit to/from Stand Sit to Stand: Min guard           General transfer comment: cues for LE management and use of UEs to self assist     Ambulation/Gait Ambulation/Gait assistance: Min assist, Min guard Gait Distance (Feet): 120 Feet Assistive device: Rolling walker (2 wheels) Gait Pattern/deviations: Step-to pattern, Decreased step length - right, Decreased step length - left, Shuffle, Trunk flexed Gait velocity: decr     General Gait Details: cues for sequence, posture and position from [employer]             Wheelchair Mobility    Modified Rankin (Stroke Patients Only)       Balance Overall balance assessment: Needs assistance Sitting-balance support: Feet supported, No upper extremity supported Sitting balance-Leahy Scale: Good     Standing balance support: Single extremity supported Standing balance-Leahy Scale: Poor                              Cognition Arousal/Alertness: Awake/alert Behavior During Therapy: WFL for tasks assessed/performed Overall Cognitive Status: Within Functional Limits for tasks assessed                                          Exercises Total Joint Exercises Ankle Circles/Pumps: AROM, Both, 15 reps, Supine Quad Sets: AROM, Both, 10 reps, Supine Heel Slides: AAROM, Left, 15 reps, Supine Straight Leg Raises: AAROM, AROM, Left, Supine, 10 reps Long Arc Quad: AAROM, Left, 10 reps, Seated Goniometric ROM: L knee -5 - 60    General Comments        Pertinent Vitals/Pain Pain Assessment Pain Assessment: 0-10 Pain Score: 4  Pain Location: L knee Pain  Descriptors / Indicators: Aching, Sore Pain Intervention(s): Monitored during session, Limited activity within patient's tolerance, Ice applied, Premedicated before session    Home Living                          Prior Function            PT Goals (current goals can now be found in the care plan section) Acute Rehab PT Goals Patient Stated Goal: Regain IND PT Goal Formulation: With patient Time For Goal Achievement: 07/05/22 Potential to Achieve Goals:  Good Progress towards PT goals: Progressing toward goals    Frequency    7X/week      PT Plan Current plan remains appropriate    Co-evaluation              AM-PAC PT "6 Clicks" Mobility   Outcome Measure  Help needed turning from your back to your side while in a flat bed without using bedrails?: A Little Help needed moving from lying on your back to sitting on the side of a flat bed without using bedrails?: A Little Help needed moving to and from a bed to a chair (including a wheelchair)?: A Little Help needed standing up from a chair using your arms (e.g., wheelchair or bedside chair)?: A Little Help needed to walk in hospital room?: A Little Help needed climbing 3-5 steps with a railing? : A Lot 6 Click Score: 17    End of Session Equipment Utilized During Treatment: Gait belt Activity Tolerance: Patient tolerated treatment well Patient left: in chair;with call bell/phone within reach;with chair alarm set Nurse Communication: Mobility status PT Visit Diagnosis: Difficulty in walking, not elsewhere classified (R26.2)     Time: [id number] PT Time Calculation (min) (ACUTE ONLY): 28 min  Charges:  $Gait Training: 8-22 mins $Therapeutic Exercise: 8-22 mins                     Debe Coder PT Acute Rehabilitation Services Pager [phone] Office [phone]    Raeleen Winstanley 06/29/2022, 12:26 PM

## 2022-06-29 NOTE — Progress Notes (Signed)
   Subjective: 1 Day Post-Op Procedure(s) (LRB): TOTAL KNEE ARTHROPLASTY (Left) Patient reports pain as mild.   Patient seen in rounds for Dr. Alvan Dame. Patient is well, and has had no acute complaints or problems. No acute events overnight. Foley catheter removed. Patient ambulated 38 feet with PT.  We will continue  therapy today.   Objective: Vital signs in last 24 hours: Temp:  [97.5 F (36.4 C)-98.3 F (36.8 C)] 98.3 F (36.8 C) (03/08 0512) Pulse Rate:  [66-91] 70 (03/08 0512) Resp:  [9-20] 18 (03/08 0512) BP: (85-142)/(43-70) 107/55 (03/08 0512) SpO2:  [90 %-100 %] 93 % (03/08 0512)  Intake/Output from previous day:  Intake/Output Summary (Last 24 hours) at 06/29/2022 0807 Last data filed at 06/29/2022 0500 Gross per 24 hour  Intake 3426.68 ml  Output 1950 ml  Net 1476.68 ml     Intake/Output this shift: No intake/output data recorded.  Labs: Recent Labs    06/29/22 0341  HGB 11.5*   Recent Labs    06/29/22 0341  WBC 11.1*  RBC 3.77*  HCT 35.9*  PLT 200   Recent Labs    06/29/22 0341  NA 140  K 3.9  CL 104  CO2 24  BUN 23  CREATININE 0.86  GLUCOSE 148*  CALCIUM 8.9   No results for input(s): "LABPT", "INR" in the last 72 hours.  Exam: General - Patient is Alert and Oriented Extremity - Neurologically intact Sensation intact distally Intact pulses distally Dorsiflexion/Plantar flexion intact Dressing - dressing C/D/I Motor Function - intact, moving foot and toes well on exam.   Past Medical History:  Diagnosis Date   Arthritis    GERD (gastroesophageal reflux disease)    Hypercholesteremia    Hypertension    Neuropathy    Osteomyelitis (HCC)    left 2nd toe   Pneumonia    as a child   Pre-diabetes     Assessment/Plan: 1 Day Post-Op Procedure(s) (LRB): TOTAL KNEE ARTHROPLASTY (Left) Principal Problem:   S/P total knee arthroplasty, left  Estimated body mass index is 32.7 kg/m as calculated from the following:   Height as of this  encounter: 5\' 5"  (1.651 m).   Weight as of this encounter: 89.1 kg. Advance diet Up with therapy D/C IV fluids   Patient's anticipated LOS is less than 2 midnights, meeting these requirements: - Younger than 63 - Lives within 1 hour of care - Has a competent adult at home to recover with post-op recover - NO history of  - Chronic pain requiring opiods  - Diabetes  - Coronary Artery Disease  - Heart failure  - Heart attack  - Stroke  - DVT/VTE  - Cardiac arrhythmia  - Respiratory Failure/COPD  - Renal failure  - Anemia  - Advanced Liver disease     DVT Prophylaxis - Aspirin Weight bearing as tolerated.  Hgb stable at 11.5 this AM.  Plan is to go Home after hospital stay. Plan for discharge today following 1-2 sessions of PT as long as they are meeting their goals. Patient is scheduled for OPPT. Follow up in the office in 2 weeks.   Griffith Citron, PA-C Orthopedic Surgery [phone] 06/29/2022, 8:07 AM

## 2022-06-29 NOTE — Progress Notes (Signed)
Pt alert and oriented, surgical dressing clean, dry and intact. No questions or queries regarding discharge instructions. Belongings given back to patient.

## 2022-06-29 NOTE — Progress Notes (Signed)
Physical Therapy Treatment Patient Details Name: [name] MRN: [id number] DOB: 10-06-1955 Today's Date: 06/29/2022   History of Present Illness Pt s/p L TKR and with hx of htn and pre-diabeties    PT Comments    Pt continues very cooperative and progressing well with mobility.  Pt up to bathroom for toileting, ambulated in hall, negotiated stairs and reviewed written HEP.  Pt son present for full session.  Pt eager for dc home this date.   Recommendations for follow up therapy are one component of a multi-disciplinary discharge planning process, led by the attending physician.  Recommendations may be updated based on patient status, additional functional criteria and insurance authorization.  Follow Up Recommendations  Follow physician's recommendations for discharge plan and follow up therapies     Assistance Recommended at Discharge Frequent or constant Supervision/Assistance  Patient can return home with the following A little help with walking and/or transfers;A little help with bathing/dressing/bathroom;Assist for transportation;Help with stairs or ramp for entrance;Assistance with cooking/housework   Equipment Recommendations  Rolling walker (2 wheels)    Recommendations for Other Services       Precautions / Restrictions Precautions Precautions: Knee;Fall Restrictions Weight Bearing Restrictions: No LLE Weight Bearing: Weight bearing as tolerated Other Position/Activity Restrictions: WBAT     Mobility  Bed Mobility Overal bed mobility: Needs Assistance Bed Mobility: Supine to Sit     Supine to sit: Min guard, Supervision     General bed mobility comments: pt up in chair and returns to same    Transfers Overall transfer level: Needs assistance Equipment used: Rolling walker (2 wheels) Transfers: Sit to/from Stand Sit to Stand: Min guard, Supervision           General transfer comment: cues for LE management and use of UEs to self assist     Ambulation/Gait Ambulation/Gait assistance: Min guard, Supervision Gait Distance (Feet): 140 Feet Assistive device: Rolling walker (2 wheels) Gait Pattern/deviations: Step-to pattern, Decreased step length - right, Decreased step length - left, Shuffle, Trunk flexed Gait velocity: decr     General Gait Details: cues for sequence, posture and position from RW   Stairs Stairs: Yes Stairs assistance: Min assist Stair Management: No rails, Step to pattern, Backwards, With walker Number of Stairs: 4 General stair comments: 2 steps twice with RW bkwd; cues for sequence and son assisting on second attempt   Wheelchair Mobility    Modified Rankin (Stroke Patients Only)       Balance Overall balance assessment: Needs assistance Sitting-balance support: Feet supported, No upper extremity supported Sitting balance-Leahy Scale: Good     Standing balance support: Single extremity supported Standing balance-Leahy Scale: Poor                              Cognition Arousal/Alertness: Awake/alert Behavior During Therapy: WFL for tasks assessed/performed Overall Cognitive Status: Within Functional Limits for tasks assessed                                          Exercises Total Joint Exercises Ankle Circles/Pumps: AROM, Both, 15 reps, Supine Quad Sets: AROM, Both, 10 reps, Supine Heel Slides: AAROM, Left, 15 reps, Supine Straight Leg Raises: AAROM, AROM, Left, Supine, 10 reps Long Arc Quad: AAROM, Left, 10 reps, Seated Goniometric ROM: L knee -5 - 60    General Comments  Pertinent Vitals/Pain Pain Assessment Pain Assessment: 0-10 Pain Score: 4  Pain Location: L knee Pain Descriptors / Indicators: Aching, Sore Pain Intervention(s): Limited activity within patient's tolerance, Monitored during session, Ice applied, Premedicated before session    Home Living                          Prior Function            PT Goals  (current goals can now be found in the care plan section) Acute Rehab PT Goals Patient Stated Goal: Regain IND PT Goal Formulation: With patient Time For Goal Achievement: 07/05/22 Potential to Achieve Goals: Good Progress towards PT goals: Progressing toward goals    Frequency    7X/week      PT Plan Current plan remains appropriate    Co-evaluation              AM-PAC PT "6 Clicks" Mobility   Outcome Measure  Help needed turning from your back to your side while in a flat bed without using bedrails?: A Little Help needed moving from lying on your back to sitting on the side of a flat bed without using bedrails?: A Little Help needed moving to and from a bed to a chair (including a wheelchair)?: A Little Help needed standing up from a chair using your arms (e.g., wheelchair or bedside chair)?: A Little Help needed to walk in hospital room?: A Little Help needed climbing 3-5 steps with a railing? : A Little 6 Click Score: 18    End of Session Equipment Utilized During Treatment: Gait belt Activity Tolerance: Patient tolerated treatment well Patient left: in chair;with call bell/phone within reach;with chair alarm set Nurse Communication: Mobility status PT Visit Diagnosis: Difficulty in walking, not elsewhere classified (R26.2)     Time: 1349-1420 PT Time Calculation (min) (ACUTE ONLY): 31 min  Charges:  $Gait Training: 8-22 mins $Therapeutic Exercise: 8-22 mins $Therapeutic Activity: 8-22 mins                     [place] Pager [phone] Office [phone]    Coralynn Gaona 06/29/2022, 3:44 PM

## 2022-06-29 NOTE — TOC Transition Note (Signed)
Transition of Care [facility]) - CM/SW Discharge Note   Patient Details  Name: [name] MRN: [id number] Date of Birth: 28-Aug-1955  Transition of Care [facility]) CM/SW Contact:  Lennart Pall, LCSW Phone Number: 06/29/2022, 11:39 AM   Clinical Narrative:    Met with pt and confirming RW delivered to room via [facility].  OPPT already arranged with [facility]. No further TOC needs.   Final next level of care: OP Rehab Barriers to Discharge: No Barriers Identified   Patient Goals and CMS Choice      Discharge Placement                         Discharge Plan and Services Additional resources added to the After Visit Summary for                  DME Arranged: Walker rolling DME Agency: [place] [facility]                  Social Determinants of Health (SDOH) Interventions SDOH Screenings   Food Insecurity: No Food Insecurity (06/28/2022)  Housing: Low Risk  (06/28/2022)  Transportation Needs: No Transportation Needs (06/28/2022)  Utilities: Not At Risk (06/28/2022)  Depression (PHQ2-9): Low Risk  (03/28/2022)  Tobacco Use: Medium Risk (06/29/2022)     Readmission Risk Interventions     No data to display

## 2022-07-02 DIAGNOSIS — R269 Unspecified abnormalities of gait and mobility: Secondary | ICD-10-CM | POA: Diagnosis not present

## 2022-07-02 DIAGNOSIS — M25562 Pain in left knee: Secondary | ICD-10-CM | POA: Diagnosis not present

## 2022-07-02 DIAGNOSIS — M25662 Stiffness of left knee, not elsewhere classified: Secondary | ICD-10-CM | POA: Diagnosis not present

## 2022-07-04 DIAGNOSIS — M25562 Pain in left knee: Secondary | ICD-10-CM | POA: Diagnosis not present

## 2022-07-04 DIAGNOSIS — R269 Unspecified abnormalities of gait and mobility: Secondary | ICD-10-CM | POA: Diagnosis not present

## 2022-07-04 DIAGNOSIS — M25662 Stiffness of left knee, not elsewhere classified: Secondary | ICD-10-CM | POA: Diagnosis not present

## 2022-07-06 DIAGNOSIS — R269 Unspecified abnormalities of gait and mobility: Secondary | ICD-10-CM | POA: Diagnosis not present

## 2022-07-06 DIAGNOSIS — M25562 Pain in left knee: Secondary | ICD-10-CM | POA: Diagnosis not present

## 2022-07-06 DIAGNOSIS — M25662 Stiffness of left knee, not elsewhere classified: Secondary | ICD-10-CM | POA: Diagnosis not present

## 2022-07-09 DIAGNOSIS — M25562 Pain in left knee: Secondary | ICD-10-CM | POA: Diagnosis not present

## 2022-07-09 DIAGNOSIS — M25662 Stiffness of left knee, not elsewhere classified: Secondary | ICD-10-CM | POA: Diagnosis not present

## 2022-07-09 DIAGNOSIS — R269 Unspecified abnormalities of gait and mobility: Secondary | ICD-10-CM | POA: Diagnosis not present

## 2022-07-11 DIAGNOSIS — R269 Unspecified abnormalities of gait and mobility: Secondary | ICD-10-CM | POA: Diagnosis not present

## 2022-07-11 DIAGNOSIS — M25562 Pain in left knee: Secondary | ICD-10-CM | POA: Diagnosis not present

## 2022-07-11 DIAGNOSIS — M25662 Stiffness of left knee, not elsewhere classified: Secondary | ICD-10-CM | POA: Diagnosis not present

## 2022-07-12 NOTE — Discharge Summary (Signed)
Patient ID: [name] MRN: [id number] DOB/AGE: 10-30-1955 67 y.o.  Admit date: 06/28/2022 Discharge date: 06/29/2022  Admission Diagnoses:  Left knee osteoarthritis  Discharge Diagnoses:  Principal Problem:   S/P total knee arthroplasty, left   Past Medical History:  Diagnosis Date   Arthritis    GERD (gastroesophageal reflux disease)    Hypercholesteremia    Hypertension    Neuropathy    Osteomyelitis (HCC)    left 2nd toe   Pneumonia    as a child   Pre-diabetes     Surgeries: Procedure(s): TOTAL KNEE ARTHROPLASTY on 06/28/2022   Consultants:   Discharged Condition: Improved  Hospital Course: [name] is an 67 y.o. female who was admitted 06/28/2022 for operative treatment ofS/P total knee arthroplasty, left. Patient has severe unremitting pain that affects sleep, daily activities, and work/hobbies. After pre-op clearance the patient was taken to the operating room on 06/28/2022 and underwent  Procedure(s): TOTAL KNEE ARTHROPLASTY.    Patient was given perioperative antibiotics:  Anti-infectives (From admission, onward)    Start     Dose/Rate Route Frequency Ordered Stop   06/28/22 1400  ceFAZolin (ANCEF) IVPB 2g/100 mL premix        2 g 200 mL/hr over 30 Minutes Intravenous Every 6 hours 06/28/22 1221 06/28/22 2146   06/28/22 0600  ceFAZolin (ANCEF) IVPB 2g/100 mL premix        2 g 200 mL/hr over 30 Minutes Intravenous On call to O.R. 06/28/22 0532 06/28/22 0804        Patient was given sequential compression devices, early ambulation, and chemoprophylaxis to prevent DVT. Patient worked with PT and was meeting their goals regarding safe ambulation and transfers.  Patient benefited maximally from hospital stay and there were no complications.    Recent vital signs: No data found.   Recent laboratory studies: No results for input(s): "WBC", "HGB", "HCT", "PLT", "NA", "K", "CL", "CO2", "BUN", "CREATININE", "GLUCOSE", "INR", "CALCIUM" in the last 72  hours.  Invalid input(s): "PT", "2"   Discharge Medications:   Allergies as of 06/29/2022       Reactions   Wound Dressing Adhesive Rash        Medication List     TAKE these medications    acetaminophen 500 MG tablet Commonly known as: TYLENOL Take 2 tablets (1,000 mg total) by mouth every 6 (six) hours.   amLODipine 5 MG tablet Commonly known as: NORVASC Take 5 mg by mouth at bedtime.   aspirin 81 MG chewable tablet Chew 1 tablet (81 mg total) by mouth 2 (two) times daily for 28 days.   atorvastatin 20 MG tablet Commonly known as: LIPITOR Take 20 mg by mouth daily.   CITRACAL PETITES/VITAMIN D PO Take 2 tablets by mouth 2 (two) times daily.   cyanocobalamin 1000 MCG tablet Commonly known as: VITAMIN B12 Take 1,000 mcg by mouth daily.   Fish Oil 1000 MG Caps Take 1,000 mg by mouth daily.   folic acid [id number] MCG tablet Commonly known as: FOLVITE Take 800 mcg by mouth in the morning.   gabapentin 300 MG capsule Commonly known as: NEURONTIN Take 600 mg by mouth at bedtime.   meloxicam 7.5 MG tablet Commonly known as: MOBIC Take 7.5 mg by mouth in the morning.   methocarbamol 500 MG tablet Commonly known as: ROBAXIN Take 1 tablet (500 mg total) by mouth every 6 (six) hours as needed for muscle spasms.   multivitamin tablet Take 1 tablet by mouth daily. Centrum  silver Woman   mupirocin ointment 2 % Commonly known as: BACTROBAN Apply 1 Application topically 2 (two) times daily.   olmesartan-hydrochlorothiazide 40-25 MG tablet Commonly known as: BENICAR HCT Take 1 tablet by mouth in the morning.   oxyCODONE 5 MG immediate release tablet Commonly known as: Oxy IR/ROXICODONE Take 1 tablet (5 mg total) by mouth every 4 (four) hours as needed for severe pain.   polyethylene glycol 17 g packet Commonly known as: MIRALAX / GLYCOLAX Take 17 g by mouth 2 (two) times daily.   PROBIOTIC PO Take 1 capsule by mouth in the morning.   Vitamin D3 250 MCG (10000  UT) Caps Take 10,000 Units by mouth in the morning.       ASK your doctor about these medications    tranexamic acid 650 MG Tabs tablet Commonly known as: LYSTEDA Take 3 tablets (1,950 mg total) by mouth daily for 2 days. Ask about: Should I take this medication?               Discharge Care Instructions  (From admission, onward)           Start     Ordered   06/29/22 0000  Change dressing       Comments: Maintain surgical dressing until follow up in the clinic. If the edges start to pull up, may reinforce with tape. If the dressing is no longer working, may remove and cover with gauze and tape, but must keep the area dry and clean.  Call with any questions or concerns.   06/29/22 0811            Diagnostic Studies: No results found.  Disposition: Discharge disposition: 01-Home or Self Care       Discharge Instructions     Call MD / Call 911   Complete by: As directed    If you experience chest pain or shortness of breath, CALL 911 and be transported to the hospital emergency room.  If you develope a fever above 101 F, pus (white drainage) or increased drainage or redness at the wound, or calf pain, call your surgeon's office.   Change dressing   Complete by: As directed    Maintain surgical dressing until follow up in the clinic. If the edges start to pull up, may reinforce with tape. If the dressing is no longer working, may remove and cover with gauze and tape, but must keep the area dry and clean.  Call with any questions or concerns.   Constipation Prevention   Complete by: As directed    Drink plenty of fluids.  Prune juice may be helpful.  You may use a stool softener, such as Colace (over the counter) 100 mg twice a day.  Use MiraLax (over the counter) for constipation as needed.   Diet - low sodium heart healthy   Complete by: As directed    Increase activity slowly as tolerated   Complete by: As directed    Weight bearing as tolerated with  assist device (walker, cane, etc) as directed, use it as long as suggested by your surgeon or therapist, typically at least 4-6 weeks.   Post-operative opioid taper instructions:   Complete by: As directed    POST-OPERATIVE OPIOID TAPER INSTRUCTIONS: It is important to wean off of your opioid medication as soon as possible. If you do not need pain medication after your surgery it is ok to stop day one. Opioids include: Codeine, Hydrocodone(Norco, Vicodin), Oxycodone(Percocet, oxycontin) and hydromorphone amongst  others.  Long term and even short term use of opiods can cause: Increased pain response Dependence Constipation Depression Respiratory depression And more.  Withdrawal symptoms can include Flu like symptoms Nausea, vomiting And more Techniques to manage these symptoms Hydrate well Eat regular healthy meals Stay active Use relaxation techniques(deep breathing, meditating, yoga) Do Not substitute Alcohol to help with tapering If you have been on opioids for less than two weeks and do not have pain than it is ok to stop all together.  Plan to wean off of opioids This plan should start within one week post op of your joint replacement. Maintain the same interval or time between taking each dose and first decrease the dose.  Cut the total daily intake of opioids by one tablet each day Next start to increase the time between doses. The last dose that should be eliminated is the evening dose.      TED hose   Complete by: As directed    Use stockings (TED hose) for 2 weeks on both leg(s).  You may remove them at night for sleeping.        Follow-up Information     Paralee Cancel, MD. Go on 07/12/2022.   Specialty: Orthopedic Surgery Why: You are scheduled for first post op appt on Thursday March 21 at 2:15pm. Contact information: [address] [place] [address] [phone]                  Signed: Irving Copas 07/12/2022, 7:15  AM

## 2022-07-13 DIAGNOSIS — M25562 Pain in left knee: Secondary | ICD-10-CM | POA: Diagnosis not present

## 2022-07-13 DIAGNOSIS — M25662 Stiffness of left knee, not elsewhere classified: Secondary | ICD-10-CM | POA: Diagnosis not present

## 2022-07-13 DIAGNOSIS — R269 Unspecified abnormalities of gait and mobility: Secondary | ICD-10-CM | POA: Diagnosis not present

## 2022-07-16 DIAGNOSIS — R269 Unspecified abnormalities of gait and mobility: Secondary | ICD-10-CM | POA: Diagnosis not present

## 2022-07-16 DIAGNOSIS — M25662 Stiffness of left knee, not elsewhere classified: Secondary | ICD-10-CM | POA: Diagnosis not present

## 2022-07-16 DIAGNOSIS — M25562 Pain in left knee: Secondary | ICD-10-CM | POA: Diagnosis not present

## 2022-07-18 DIAGNOSIS — R269 Unspecified abnormalities of gait and mobility: Secondary | ICD-10-CM | POA: Diagnosis not present

## 2022-07-18 DIAGNOSIS — M25562 Pain in left knee: Secondary | ICD-10-CM | POA: Diagnosis not present

## 2022-07-18 DIAGNOSIS — M25662 Stiffness of left knee, not elsewhere classified: Secondary | ICD-10-CM | POA: Diagnosis not present

## 2022-07-20 DIAGNOSIS — M25562 Pain in left knee: Secondary | ICD-10-CM | POA: Diagnosis not present

## 2022-07-20 DIAGNOSIS — M25662 Stiffness of left knee, not elsewhere classified: Secondary | ICD-10-CM | POA: Diagnosis not present

## 2022-07-20 DIAGNOSIS — R269 Unspecified abnormalities of gait and mobility: Secondary | ICD-10-CM | POA: Diagnosis not present

## 2022-07-23 DIAGNOSIS — M25662 Stiffness of left knee, not elsewhere classified: Secondary | ICD-10-CM | POA: Diagnosis not present

## 2022-07-23 DIAGNOSIS — M25562 Pain in left knee: Secondary | ICD-10-CM | POA: Diagnosis not present

## 2022-07-23 DIAGNOSIS — R269 Unspecified abnormalities of gait and mobility: Secondary | ICD-10-CM | POA: Diagnosis not present

## 2022-07-25 DIAGNOSIS — R269 Unspecified abnormalities of gait and mobility: Secondary | ICD-10-CM | POA: Diagnosis not present

## 2022-07-25 DIAGNOSIS — M25662 Stiffness of left knee, not elsewhere classified: Secondary | ICD-10-CM | POA: Diagnosis not present

## 2022-07-25 DIAGNOSIS — M25562 Pain in left knee: Secondary | ICD-10-CM | POA: Diagnosis not present

## 2022-07-26 DIAGNOSIS — M25562 Pain in left knee: Secondary | ICD-10-CM | POA: Diagnosis not present

## 2022-07-26 DIAGNOSIS — M25662 Stiffness of left knee, not elsewhere classified: Secondary | ICD-10-CM | POA: Diagnosis not present

## 2022-07-26 DIAGNOSIS — R269 Unspecified abnormalities of gait and mobility: Secondary | ICD-10-CM | POA: Diagnosis not present

## 2022-08-09 DIAGNOSIS — Z471 Aftercare following joint replacement surgery: Secondary | ICD-10-CM | POA: Diagnosis not present

## 2022-08-09 DIAGNOSIS — Z96652 Presence of left artificial knee joint: Secondary | ICD-10-CM | POA: Diagnosis not present

## 2022-12-07 DIAGNOSIS — I10 Essential (primary) hypertension: Secondary | ICD-10-CM | POA: Diagnosis not present

## 2022-12-07 DIAGNOSIS — E6609 Other obesity due to excess calories: Secondary | ICD-10-CM | POA: Diagnosis not present

## 2022-12-07 DIAGNOSIS — Z6832 Body mass index (BMI) 32.0-32.9, adult: Secondary | ICD-10-CM | POA: Diagnosis not present

## 2022-12-07 DIAGNOSIS — N390 Urinary tract infection, site not specified: Secondary | ICD-10-CM | POA: Diagnosis not present

## 2022-12-27 DIAGNOSIS — M25571 Pain in right ankle and joints of right foot: Secondary | ICD-10-CM | POA: Diagnosis not present

## 2022-12-27 DIAGNOSIS — M79671 Pain in right foot: Secondary | ICD-10-CM | POA: Diagnosis not present

## 2022-12-27 DIAGNOSIS — S93491A Sprain of other ligament of right ankle, initial encounter: Secondary | ICD-10-CM | POA: Diagnosis not present

## 2023-01-14 DIAGNOSIS — Z4889 Encounter for other specified surgical aftercare: Secondary | ICD-10-CM | POA: Diagnosis not present

## 2023-01-14 DIAGNOSIS — S93491A Sprain of other ligament of right ankle, initial encounter: Secondary | ICD-10-CM | POA: Diagnosis not present

## 2023-02-06 ENCOUNTER — Encounter: Payer: Self-pay | Admitting: *Deleted

## 2023-02-16 DIAGNOSIS — N182 Chronic kidney disease, stage 2 (mild): Secondary | ICD-10-CM | POA: Diagnosis not present

## 2023-02-16 DIAGNOSIS — R7303 Prediabetes: Secondary | ICD-10-CM | POA: Diagnosis not present

## 2023-02-16 DIAGNOSIS — Z6833 Body mass index (BMI) 33.0-33.9, adult: Secondary | ICD-10-CM | POA: Diagnosis not present

## 2023-02-16 DIAGNOSIS — M1990 Unspecified osteoarthritis, unspecified site: Secondary | ICD-10-CM | POA: Diagnosis not present

## 2023-02-16 DIAGNOSIS — F324 Major depressive disorder, single episode, in partial remission: Secondary | ICD-10-CM | POA: Diagnosis not present

## 2023-02-16 DIAGNOSIS — M792 Neuralgia and neuritis, unspecified: Secondary | ICD-10-CM | POA: Diagnosis not present

## 2023-02-16 DIAGNOSIS — E669 Obesity, unspecified: Secondary | ICD-10-CM | POA: Diagnosis not present

## 2023-02-16 DIAGNOSIS — E785 Hyperlipidemia, unspecified: Secondary | ICD-10-CM | POA: Diagnosis not present

## 2023-02-16 DIAGNOSIS — I129 Hypertensive chronic kidney disease with stage 1 through stage 4 chronic kidney disease, or unspecified chronic kidney disease: Secondary | ICD-10-CM | POA: Diagnosis not present

## 2023-03-04 DIAGNOSIS — Z0001 Encounter for general adult medical examination with abnormal findings: Secondary | ICD-10-CM | POA: Diagnosis not present

## 2023-03-04 DIAGNOSIS — E785 Hyperlipidemia, unspecified: Secondary | ICD-10-CM | POA: Diagnosis not present

## 2023-03-04 DIAGNOSIS — Z23 Encounter for immunization: Secondary | ICD-10-CM | POA: Diagnosis not present

## 2023-03-04 DIAGNOSIS — Z6832 Body mass index (BMI) 32.0-32.9, adult: Secondary | ICD-10-CM | POA: Diagnosis not present

## 2023-03-04 DIAGNOSIS — R7309 Other abnormal glucose: Secondary | ICD-10-CM | POA: Diagnosis not present

## 2023-03-04 DIAGNOSIS — Z1331 Encounter for screening for depression: Secondary | ICD-10-CM | POA: Diagnosis not present

## 2023-03-04 DIAGNOSIS — I10 Essential (primary) hypertension: Secondary | ICD-10-CM | POA: Diagnosis not present

## 2023-03-04 DIAGNOSIS — E6609 Other obesity due to excess calories: Secondary | ICD-10-CM | POA: Diagnosis not present

## 2023-03-04 DIAGNOSIS — M67912 Unspecified disorder of synovium and tendon, left shoulder: Secondary | ICD-10-CM | POA: Diagnosis not present

## 2023-04-01 ENCOUNTER — Telehealth: Payer: Self-pay | Admitting: *Deleted

## 2023-04-01 NOTE — Telephone Encounter (Signed)
  Procedure: Colonoscopy  Height: 5'5.5 Weight: 195lbs        Have you had a colonoscopy before?  03/13/18, Dr. Jena Gauss  Do you have family history of colon cancer?  no  Do you have a family history of polyps? Yes, mother  Previous colonoscopy with polyps removed? yes  Do you have a history colorectal cancer?   no  Are you diabetic?  no  Do you have a prosthetic or mechanical heart valve? no  Do you have a pacemaker/defibrillator?   no  Have you had endocarditis/atrial fibrillation?  no  Do you use supplemental oxygen/CPAP?  no  Have you had joint replacement within the last 12 months?  yes  Do you tend to be constipated or have to use laxatives?  no   Do you have history of alcohol use? If yes, how much and how often.  no  Do you have history or are you using drugs? If yes, what do are you  using?  no  Have you ever had a stroke/heart attack?  no  Have you ever had a heart or other vascular stent placed,?no  Do you take weight loss medication? no  female patients,: have you had a hysterectomy? yes                              are you post menopausal?  yes                              do you still have your menstrual cycle? no    Date of last menstrual period?   Do you take any blood-thinning medications such as: (Plavix, aspirin, Coumadin, Aggrenox, Brilinta, Xarelto, Eliquis, Pradaxa, Savaysa or Effient)? no  If yes we need the name, milligram, dosage and who is prescribing doctor:               Current Outpatient Medications  Medication Sig Dispense Refill   amLODipine (NORVASC) 5 MG tablet Take 5 mg by mouth at bedtime.     Calcium Citrate-Vitamin D (CITRACAL PETITES/VITAMIN D PO) Take 2 tablets by mouth 2 (two) times daily.      celecoxib (CELEBREX) 200 MG capsule Take 400 mg by mouth daily.     Cholecalciferol (VITAMIN D3) 250 MCG (10000 UT) capsule Take 10,000 Units by mouth in the morning.     cyanocobalamin (VITAMIN B12) 1000 MCG tablet Take 1,000 mcg by  mouth daily.     docusate sodium (COLACE) 100 MG capsule Take 100 mg by mouth daily as needed for mild constipation.     gabapentin (NEURONTIN) 300 MG capsule Take 600 mg by mouth at bedtime.  0   Multiple Vitamin (MULTIVITAMIN) tablet Take 1 tablet by mouth daily. Centrum silver Woman     olmesartan-hydrochlorothiazide (BENICAR HCT) 40-25 MG tablet Take 1 tablet by mouth in the morning.     Omega-3 Fatty Acids (FISH OIL) 1000 MG CAPS Take 1,000 mg by mouth daily.     Probiotic Product (PROBIOTIC PO) Take 1 capsule by mouth in the morning.     No current facility-administered medications for this visit.    Allergies  Allergen Reactions   Wound Dressing Adhesive Rash

## 2023-04-11 DIAGNOSIS — R7309 Other abnormal glucose: Secondary | ICD-10-CM | POA: Diagnosis not present

## 2023-05-06 DIAGNOSIS — S93491A Sprain of other ligament of right ankle, initial encounter: Secondary | ICD-10-CM | POA: Diagnosis not present

## 2023-05-06 DIAGNOSIS — S90212A Contusion of left great toe with damage to nail, initial encounter: Secondary | ICD-10-CM | POA: Diagnosis not present

## 2023-05-06 DIAGNOSIS — L03116 Cellulitis of left lower limb: Secondary | ICD-10-CM | POA: Diagnosis not present

## 2023-05-15 NOTE — Telephone Encounter (Signed)
Ok to schedule. ASA 2. Needs bmet.

## 2023-05-16 ENCOUNTER — Other Ambulatory Visit (INDEPENDENT_AMBULATORY_CARE_PROVIDER_SITE_OTHER): Payer: Self-pay | Admitting: *Deleted

## 2023-05-16 ENCOUNTER — Encounter (INDEPENDENT_AMBULATORY_CARE_PROVIDER_SITE_OTHER): Payer: Self-pay | Admitting: *Deleted

## 2023-05-16 DIAGNOSIS — Z860100 Personal history of colon polyps, unspecified: Secondary | ICD-10-CM

## 2023-05-16 MED ORDER — PEG 3350-KCL-NA BICARB-NACL 420 G PO SOLR: 4000.0000 mL | Freq: Once | ORAL | 0 refills | Status: AC

## 2023-05-16 NOTE — Telephone Encounter (Signed)
Pt has been scheduled for 07/05/23 with Dr.Rourk, instructions mailed and prep sent to the pharamcy

## 2023-05-17 NOTE — Telephone Encounter (Signed)
Questionnaire from recall, no referral needed

## 2023-05-20 DIAGNOSIS — S93491A Sprain of other ligament of right ankle, initial encounter: Secondary | ICD-10-CM | POA: Diagnosis not present

## 2023-05-20 DIAGNOSIS — L97909 Non-pressure chronic ulcer of unspecified part of unspecified lower leg with unspecified severity: Secondary | ICD-10-CM | POA: Diagnosis not present

## 2023-05-20 DIAGNOSIS — L03116 Cellulitis of left lower limb: Secondary | ICD-10-CM | POA: Diagnosis not present

## 2023-05-20 DIAGNOSIS — S90212A Contusion of left great toe with damage to nail, initial encounter: Secondary | ICD-10-CM | POA: Diagnosis not present

## 2023-05-21 ENCOUNTER — Other Ambulatory Visit (HOSPITAL_COMMUNITY): Payer: Self-pay | Admitting: Family Medicine

## 2023-05-21 DIAGNOSIS — Z1231 Encounter for screening mammogram for malignant neoplasm of breast: Secondary | ICD-10-CM

## 2023-05-29 ENCOUNTER — Other Ambulatory Visit: Payer: Self-pay

## 2023-05-29 ENCOUNTER — Encounter (HOSPITAL_BASED_OUTPATIENT_CLINIC_OR_DEPARTMENT_OTHER): Payer: Self-pay | Admitting: Orthopaedic Surgery

## 2023-05-30 ENCOUNTER — Ambulatory Visit (HOSPITAL_COMMUNITY)
Admission: RE | Admit: 2023-05-30 | Discharge: 2023-05-30 | Disposition: A | Discharge status: Home / Self Care | Payer: Medicare PPO | Source: Ambulatory Visit | Attending: Family Medicine | Admitting: Family Medicine

## 2023-05-30 ENCOUNTER — Encounter (HOSPITAL_BASED_OUTPATIENT_CLINIC_OR_DEPARTMENT_OTHER)
Admission: RE | Admit: 2023-05-30 | Discharge: 2023-05-30 | Disposition: A | Discharge status: Home / Self Care | Payer: Medicare PPO | Source: Ambulatory Visit | Attending: Orthopaedic Surgery | Admitting: Orthopaedic Surgery

## 2023-05-30 DIAGNOSIS — Z01818 Encounter for other preprocedural examination: Secondary | ICD-10-CM | POA: Insufficient documentation

## 2023-05-30 DIAGNOSIS — Z860100 Personal history of colon polyps, unspecified: Secondary | ICD-10-CM | POA: Diagnosis not present

## 2023-05-30 DIAGNOSIS — Z1231 Encounter for screening mammogram for malignant neoplasm of breast: Secondary | ICD-10-CM | POA: Insufficient documentation

## 2023-05-30 DIAGNOSIS — Z01810 Encounter for preprocedural cardiovascular examination: Secondary | ICD-10-CM | POA: Diagnosis present

## 2023-05-30 DIAGNOSIS — Z01812 Encounter for preprocedural laboratory examination: Secondary | ICD-10-CM | POA: Diagnosis present

## 2023-05-31 ENCOUNTER — Telehealth: Payer: Self-pay | Admitting: *Deleted

## 2023-05-31 DIAGNOSIS — Z860100 Personal history of colon polyps, unspecified: Secondary | ICD-10-CM

## 2023-05-31 LAB — BASIC METABOLIC PANEL
BUN/Creatinine Ratio: 22 (ref 12–28)
BUN: 20 mg/dL (ref 8–27)
CO2: 26 mmol/L (ref 20–29)
Calcium: 9.3 mg/dL (ref 8.7–10.3)
Chloride: 101 mmol/L (ref 96–106)
Creatinine, Ser: 0.89 mg/dL (ref 0.57–1.00)
Glucose: 109 mg/dL — ABNORMAL HIGH (ref 70–99)
Potassium: 3.8 mmol/L (ref 3.5–5.2)
Sodium: 143 mmol/L (ref 134–144)
eGFR: 70 mL/min/{1.73_m2} (ref >59)

## 2023-05-31 NOTE — Telephone Encounter (Signed)
 Pt called in. She got confused on when she needed to have her labwork done and did this yesterday. Advised it has to be within 4 weeks of her procedure. New order placed.

## 2023-06-02 NOTE — H&P (Signed)
 ORTHOPAEDIC SURGERY H&P  Subjective:  The patient presents with left leg wound.   Past Medical History:  Diagnosis Date   Arthritis    lt knee   GERD (gastroesophageal reflux disease)    Hypercholesteremia    Hypertension    Neuropathy    Osteomyelitis (HCC)    left 2nd toe   Pneumonia    as a child   Pre-diabetes    Ulcer of left lower extremity (HCC)     Past Surgical History:  Procedure Laterality Date   ABDOMINAL HYSTERECTOMY     AMPUTATION TOE Left 01/11/2022   Procedure: Left 2nd toe partial amputation;  Surgeon: Barton Drape, MD;  Location: [facility];  Service: Orthopedics;  Laterality: Left;  60 min   BIOPSY  03/13/2018   Procedure: BIOPSY;  Surgeon: Shaaron Lamar HERO, MD;  Location: [facility];  Service: Endoscopy;;  (colon)   BLADDER SUSPENSION     COLONOSCOPY WITH PROPOFOL  N/A 03/13/2018   Procedure: COLONOSCOPY WITH PROPOFOL ;  Surgeon: Shaaron Lamar HERO, MD;  Location: [facility];  Service: Endoscopy;  Laterality: N/A;  1:00pm-rescheduled to 11/21 @ 1:30pm per Mindy   TOTAL KNEE ARTHROPLASTY Left 06/28/2022   Procedure: TOTAL KNEE ARTHROPLASTY;  Surgeon: Ernie Cough, MD;  Location: [facility];  Service: Orthopedics;  Laterality: Left;     (Not in an outpatient encounter)    Allergies  Allergen Reactions   Wound Dressing Adhesive Rash    Social History   Socioeconomic History   Marital status: Widowed    Spouse name: Not on file   Number of children: Not on file   Years of education: Not on file   Highest education level: Not on file  Occupational History   Not on file  Tobacco Use   Smoking status: Former   Smokeless tobacco: Never  Vaping Use   Vaping status: Never Used  Substance and Sexual Activity   Alcohol use: Never   Drug use: Never   Sexual activity: Not Currently    Birth control/protection: Surgical    Comment: hyst  Other Topics Concern   Not on file  Social History Narrative   Not on file   Social  Drivers of Health   Financial Resource Strain: Not on file  Food Insecurity: No Food Insecurity (06/28/2022)   Hunger Vital Sign    Worried About Running Out of Food in the Last Year: Never true    Ran Out of Food in the Last Year: Never true  Transportation Needs: No Transportation Needs (06/28/2022)   PRAPARE - Administrator, Civil Service (Medical): No    Lack of Transportation (Non-Medical): No  Physical Activity: Not on file  Stress: Not on file  Social Connections: Not on file  Intimate Partner Violence: Not At Risk (06/28/2022)   Humiliation, Afraid, Rape, and Kick questionnaire    Fear of Current or Ex-Partner: No    Emotionally Abused: No    Physically Abused: No    Sexually Abused: No     History reviewed. No pertinent family history.   Review of Systems Pertinent items are noted in HPI.  Objective: Vital signs in last 24 hours:    05/29/2023   12:36 PM 06/29/2022   12:36 PM 06/29/2022    9:09 AM  Vitals with BMI  Height 5' 5    Weight 196 lbs    BMI 32.62    Systolic  97 126  Diastolic  80 65  Pulse  64 68      EXAM: General: Well nourished, well developed. Awake, alert and oriented to time, place, person. Normal mood and affect. No apparent distress. Breathing room air.  Operative Lower Extremity: Alignment - Neutral Deformity - None Skin intact Tenderness to palpation - left leg wound anterior 5/5 TA, PT, GS, Per, EHL, FHL Sensation intact to light touch throughout Palpable DP and PT pulses Special testing: None  The contralateral foot/ankle was examined for comparison and noted to be neurovascularly intact with no localized deformity, swelling, or tenderness.  Imaging Review All images taken were independently reviewed by me.  Assessment/Plan: The clinical and radiographic findings were reviewed and discussed at length with the patient.  The patient has left leg wound.  We spoke at length about the natural course of these findings.  We discussed nonoperative and operative treatment options in detail.  The risks and benefits were presented and reviewed. The risks due to hardware failure/irritation, new/persistent/recurrent infection, stiffness, nerve/vessel/tendon injury, nonunion/malunion of any fracture, wound healing issues, allograft usage, development of arthritis, failure of this surgery, possibility of external fixation in certain situations, possibility of delayed definitive surgery, need for further surgery, prolonged wound care including further soft tissue coverage procedures, thromboembolic events, anesthesia/medical complications/events perioperatively and beyond, amputation, death among others were discussed. The patient acknowledged the explanation and agreed to proceed with the plan.  [name]  Orthopaedic Surgery [facility]

## 2023-06-02 NOTE — Discharge Instructions (Addendum)
Crystal Cedars, MD [facility]  Please read the following information regarding your care after surgery.  Medications  You only need a prescription for the narcotic pain medicine (ex. oxycodone, Percocet, Norco).  All of the other medicines listed below are available over the counter. ? Aleve 2 pills twice a day for the first 3 days after surgery. ? acetominophen (Tylenol) 650 mg every 4-6 hours as you need for minor to moderate pain ? oxycodone as prescribed for severe pain  ? To help prevent blood clots, take aspirin (81 mg) twice daily for 28 days after surgery.  You should also get up every hour while you are awake to move around.  Weight Bearing ? OK to walk on the operative leg only AFTER the nerve block has completely worn off.  Cast / Splint / Dressing ? Keep your dressing clean and dry.  Don't put anything (coat hanger, pencil, etc) down inside of it.  If it gets wet, please notify the office immediately.  Swelling IMPORTANT: It is normal for you to have swelling where you had surgery. To reduce swelling and pain, keep at least 3 pillows under your leg so that your toes are above your nose and your heel is above the level of your hip.  It may be necessary to keep your foot or leg elevated for several weeks.  This is critical to helping your incisions heal and your pain to feel better.  Follow Up Call my office at [phone] when you are discharged from the hospital or surgery center to schedule an appointment to be seen within 7-10 days after surgery.  Call my office at [phone] if you develop a fever >101.5 F, nausea, vomiting, bleeding from the surgical site or severe pain.     Post Anesthesia Home Care Instructions  Activity: Get plenty of rest for the remainder of the day. A responsible individual must stay with you for 24 hours following the procedure.  For the next 24 hours, DO NOT: -Drive a car -Advertising copywriter -Drink alcoholic beverages -Take any  medication unless instructed by your physician -Make any legal decisions or sign important papers.  Meals: Start with liquid foods such as gelatin or soup. Progress to regular foods as tolerated. Avoid greasy, spicy, heavy foods. If nausea and/or vomiting occur, drink only clear liquids until the nausea and/or vomiting subsides. Call your physician if vomiting continues.  Special Instructions/Symptoms: Your throat may feel dry or sore from the anesthesia or the breathing tube placed in your throat during surgery. If this causes discomfort, gargle with warm salt water. The discomfort should disappear within 24 hours.  Next dose of Tylenol can be taken at 7pm today if needed.

## 2023-06-05 ENCOUNTER — Encounter (HOSPITAL_BASED_OUTPATIENT_CLINIC_OR_DEPARTMENT_OTHER): Payer: Self-pay | Admitting: Orthopaedic Surgery

## 2023-06-05 ENCOUNTER — Other Ambulatory Visit: Payer: Self-pay

## 2023-06-05 ENCOUNTER — Ambulatory Visit (HOSPITAL_BASED_OUTPATIENT_CLINIC_OR_DEPARTMENT_OTHER): Payer: Medicare PPO | Admitting: Anesthesiology

## 2023-06-05 ENCOUNTER — Encounter (HOSPITAL_BASED_OUTPATIENT_CLINIC_OR_DEPARTMENT_OTHER)
Admission: RE | Disposition: A | Discharge status: Home / Self Care | Payer: Self-pay | Source: Home / Self Care | Attending: Orthopaedic Surgery

## 2023-06-05 ENCOUNTER — Ambulatory Visit (HOSPITAL_BASED_OUTPATIENT_CLINIC_OR_DEPARTMENT_OTHER)
Admission: RE | Admit: 2023-06-05 | Discharge: 2023-06-05 | Disposition: A | Discharge status: Home / Self Care | Payer: Medicare PPO | Attending: Orthopaedic Surgery | Admitting: Orthopaedic Surgery

## 2023-06-05 DIAGNOSIS — S8012XA Contusion of left lower leg, initial encounter: Secondary | ICD-10-CM | POA: Insufficient documentation

## 2023-06-05 DIAGNOSIS — Z87891 Personal history of nicotine dependence: Secondary | ICD-10-CM | POA: Insufficient documentation

## 2023-06-05 DIAGNOSIS — I10 Essential (primary) hypertension: Secondary | ICD-10-CM | POA: Diagnosis not present

## 2023-06-05 DIAGNOSIS — K219 Gastro-esophageal reflux disease without esophagitis: Secondary | ICD-10-CM | POA: Insufficient documentation

## 2023-06-05 DIAGNOSIS — X58XXXA Exposure to other specified factors, initial encounter: Secondary | ICD-10-CM | POA: Insufficient documentation

## 2023-06-05 DIAGNOSIS — L97921 Non-pressure chronic ulcer of unspecified part of left lower leg limited to breakdown of skin: Secondary | ICD-10-CM

## 2023-06-05 HISTORY — PX: INCISION AND DRAINAGE OF WOUND: SHX1803

## 2023-06-05 HISTORY — DX: Non-pressure chronic ulcer of unspecified part of left lower leg with unspecified severity: L97.929

## 2023-06-05 SURGERY — IRRIGATION AND DEBRIDEMENT WOUND
Anesthesia: General | Site: Leg Lower | Laterality: Left

## 2023-06-05 MED ORDER — VANCOMYCIN HCL 500 MG IV SOLR
INTRAVENOUS | Status: DC | PRN
  Administered 2023-06-05: 500 mg via TOPICAL

## 2023-06-05 MED ORDER — MIDAZOLAM HCL 2 MG/2ML IJ SOLN
INTRAMUSCULAR | Status: AC
  Filled 2023-06-05: qty 2

## 2023-06-05 MED ORDER — DEXMEDETOMIDINE HCL IN NACL 80 MCG/20ML IV SOLN
INTRAVENOUS | Status: DC | PRN
Start: 2023-06-05 — End: 2023-06-05
  Administered 2023-06-05: 8 ug via INTRAVENOUS

## 2023-06-05 MED ORDER — MIDAZOLAM HCL 2 MG/2ML IJ SOLN
INTRAMUSCULAR | Status: DC | PRN
  Administered 2023-06-05: 1 mg via INTRAVENOUS

## 2023-06-05 MED ORDER — OXYCODONE HCL 5 MG PO TABS: 5.0000 mg | ORAL_TABLET | Freq: Once | ORAL | Status: DC | PRN

## 2023-06-05 MED ORDER — FENTANYL CITRATE (PF) 100 MCG/2ML IJ SOLN
INTRAMUSCULAR | Status: AC
  Filled 2023-06-05: qty 2

## 2023-06-05 MED ORDER — KETOROLAC TROMETHAMINE 30 MG/ML IJ SOLN
INTRAMUSCULAR | Status: AC
  Filled 2023-06-05: qty 1

## 2023-06-05 MED ORDER — 0.9 % SODIUM CHLORIDE (POUR BTL) OPTIME
TOPICAL | Status: DC | PRN
  Administered 2023-06-05: 2000 mL

## 2023-06-05 MED ORDER — DEXAMETHASONE SODIUM PHOSPHATE 10 MG/ML IJ SOLN
INTRAMUSCULAR | Status: AC
Start: 2023-06-05 — End: ?
  Filled 2023-06-05: qty 1

## 2023-06-05 MED ORDER — PROPOFOL 500 MG/50ML IV EMUL
INTRAVENOUS | Status: DC | PRN
Start: 2023-06-05 — End: 2023-06-05
  Administered 2023-06-05 (×2): 100 mg via INTRAVENOUS

## 2023-06-05 MED ORDER — ONDANSETRON HCL 4 MG/2ML IJ SOLN
INTRAMUSCULAR | Status: DC | PRN
  Administered 2023-06-05: 4 mg via INTRAVENOUS

## 2023-06-05 MED ORDER — CEFAZOLIN SODIUM-DEXTROSE 2-4 GM/100ML-% IV SOLN
2.0000 g | INTRAVENOUS | Status: AC
  Administered 2023-06-05: 2 g via INTRAVENOUS

## 2023-06-05 MED ORDER — CEFAZOLIN SODIUM-DEXTROSE 2-4 GM/100ML-% IV SOLN
INTRAVENOUS | Status: AC
  Filled 2023-06-05: qty 100

## 2023-06-05 MED ORDER — FENTANYL CITRATE (PF) 100 MCG/2ML IJ SOLN
INTRAMUSCULAR | Status: DC | PRN
Start: 2023-06-05 — End: 2023-06-05
  Administered 2023-06-05: 50 ug via INTRAVENOUS

## 2023-06-05 MED ORDER — ACETAMINOPHEN 500 MG PO TABS
1000.0000 mg | ORAL_TABLET | Freq: Once | ORAL | Status: AC
  Administered 2023-06-05: 1000 mg via ORAL

## 2023-06-05 MED ORDER — CHLORHEXIDINE GLUCONATE 4 % EX SOLN: 60.0000 mL | Freq: Once | CUTANEOUS | Status: DC

## 2023-06-05 MED ORDER — OXYCODONE HCL 5 MG/5ML PO SOLN: 5.0000 mg | Freq: Once | ORAL | Status: DC | PRN

## 2023-06-05 MED ORDER — BUPIVACAINE-EPINEPHRINE (PF) 0.5% -1:200000 IJ SOLN
INTRAMUSCULAR | Status: DC | PRN
  Administered 2023-06-05: 10 mL

## 2023-06-05 MED ORDER — LIDOCAINE 2% (20 MG/ML) 5 ML SYRINGE
INTRAMUSCULAR | Status: AC
  Filled 2023-06-05: qty 5

## 2023-06-05 MED ORDER — FENTANYL CITRATE (PF) 100 MCG/2ML IJ SOLN: 25.0000 ug | INTRAMUSCULAR | Status: DC | PRN

## 2023-06-05 MED ORDER — ACETAMINOPHEN 500 MG PO TABS
ORAL_TABLET | ORAL | Status: AC
  Filled 2023-06-05: qty 2

## 2023-06-05 MED ORDER — ONDANSETRON HCL 4 MG/2ML IJ SOLN
INTRAMUSCULAR | Status: AC
  Filled 2023-06-05: qty 2

## 2023-06-05 MED ORDER — LACTATED RINGERS IV SOLN: INTRAVENOUS | Status: DC

## 2023-06-05 SURGICAL SUPPLY — 55 items
BANDAGE ESMARK 6X9 LF (GAUZE/BANDAGES/DRESSINGS) ×1 IMPLANT
BLADE SURG 15 STRL LF DISP TIS (BLADE) ×4 IMPLANT
BNDG COHESIVE 4X5 TAN STRL LF (GAUZE/BANDAGES/DRESSINGS) ×1 IMPLANT
BNDG ELASTIC 4INX 5YD STR LF (GAUZE/BANDAGES/DRESSINGS) ×1 IMPLANT
BNDG ELASTIC 6INX 5YD STR LF (GAUZE/BANDAGES/DRESSINGS) IMPLANT
BNDG ESMARK 6X9 LF (GAUZE/BANDAGES/DRESSINGS)
BNDG GAUZE DERMACEA FLUFF 4 (GAUZE/BANDAGES/DRESSINGS) ×1 IMPLANT
BRUSH SCRUB EZ 4% CHG (MISCELLANEOUS) ×1 IMPLANT
CANISTER SUCT 1200ML W/VALVE (MISCELLANEOUS) ×1 IMPLANT
CHLORAPREP W/TINT 26 (MISCELLANEOUS) ×2 IMPLANT
COVER BACK TABLE 60X90IN (DRAPES) ×1 IMPLANT
CUFF TRNQT CYL 34X4.125X (TOURNIQUET CUFF) IMPLANT
DRAPE C-ARM 42X72 X-RAY (DRAPES) IMPLANT
DRAPE C-ARMOR (DRAPES) IMPLANT
DRAPE EXTREMITY T 121X128X90 (DISPOSABLE) ×1 IMPLANT
DRAPE IMP U-DRAPE 54X76 (DRAPES) ×1 IMPLANT
DRAPE OEC MINIVIEW 54X84 (DRAPES) IMPLANT
DRAPE U-SHAPE 47X51 STRL (DRAPES) ×1 IMPLANT
DRSG MEPITEL 4X7.2 (GAUZE/BANDAGES/DRESSINGS) ×1 IMPLANT
ELECT REM PT RETURN 9FT ADLT (ELECTROSURGICAL) ×1
ELECTRODE REM PT RTRN 9FT ADLT (ELECTROSURGICAL) ×1 IMPLANT
GAUZE PAD ABD 8X10 STRL (GAUZE/BANDAGES/DRESSINGS) IMPLANT
GAUZE SPONGE 4X4 12PLY STRL (GAUZE/BANDAGES/DRESSINGS) ×1 IMPLANT
GLOVE BIOGEL PI IND STRL 8 (GLOVE) ×1 IMPLANT
GLOVE SURG SS PI 7.5 STRL IVOR (GLOVE) ×2 IMPLANT
GOWN STRL REUS W/ TWL LRG LVL3 (GOWN DISPOSABLE) ×2 IMPLANT
MARKER SKIN DUAL TIP RULER LAB (MISCELLANEOUS) IMPLANT
NDL HYPO 25X1 1.5 SAFETY (NEEDLE) IMPLANT
NEEDLE HYPO 25X1 1.5 SAFETY (NEEDLE) ×1 IMPLANT
NS IRRIG 1000ML POUR BTL (IV SOLUTION) ×1 IMPLANT
PACK BASIN DAY SURGERY FS (CUSTOM PROCEDURE TRAY) ×1 IMPLANT
PAD CAST 4YDX4 CTTN HI CHSV (CAST SUPPLIES) ×1 IMPLANT
PADDING CAST ABS COTTON 4X4 ST (CAST SUPPLIES) IMPLANT
PADDING CAST COTTON 6X4 STRL (CAST SUPPLIES) ×1 IMPLANT
PADDING CAST SYNTHETIC 4X4 STR (CAST SUPPLIES) IMPLANT
PENCIL SMOKE EVACUATOR (MISCELLANEOUS) ×1 IMPLANT
SHEET MEDIUM DRAPE 40X70 STRL (DRAPES) ×1 IMPLANT
SLEEVE SCD COMPRESS KNEE MED (STOCKING) ×1 IMPLANT
SPIKE FLUID TRANSFER (MISCELLANEOUS) IMPLANT
SPLINT PLASTER CAST FAST 5X30 (CAST SUPPLIES) IMPLANT
SPONGE T-LAP 18X18 ~~LOC~~+RFID (SPONGE) ×1 IMPLANT
STOCKINETTE 6 STRL (DRAPES) ×1 IMPLANT
STOCKINETTE ORTHO 6X25 (MISCELLANEOUS) ×1 IMPLANT
SUCTION TUBE FRAZIER 10FR DISP (SUCTIONS) IMPLANT
SUT ETHILON 2 0 FS 18 (SUTURE) ×2 IMPLANT
SUT MNCRL AB 3-0 PS2 18 (SUTURE) ×1 IMPLANT
SUT PROLENE 2 0 CT2 30 (SUTURE) IMPLANT
SUT VIC AB 2-0 SH 27XBRD (SUTURE) ×1 IMPLANT
SUT VIC AB 3-0 SH 27X BRD (SUTURE) IMPLANT
SUT VICRYL 0 SH 27 (SUTURE) IMPLANT
SYR BULB IRRIG 60ML STRL (SYRINGE) ×1 IMPLANT
SYR CONTROL 10ML LL (SYRINGE) IMPLANT
TOWEL GREEN STERILE FF (TOWEL DISPOSABLE) ×2 IMPLANT
TUBE CONNECTING 20X1/4 (TUBING) IMPLANT
UNDERPAD 30X36 HEAVY ABSORB (UNDERPADS AND DIAPERS) ×1 IMPLANT

## 2023-06-05 NOTE — Transfer of Care (Signed)
Immediate Anesthesia Transfer of Care Note  Patient: [name]  Procedure(s) Performed: LEFT LEG IRRIGATION AND DEBRIDEMENT WOUND (Left: Leg Lower)  Patient Location: PACU  Anesthesia Type:MAC  Level of Consciousness: awake and oriented  Airway & Oxygen Therapy: Patient Spontanous Breathing  Post-op Assessment: Report given to RN, Post -op Vital signs reviewed and stable, and Patient moving all extremities  Post vital signs: Reviewed and stable  Last Vitals:  Vitals Value Taken Time  BP 84/51 06/05/23 1345  Temp    Pulse 61 06/05/23 1350  Resp 17 06/05/23 1350  SpO2 88 % 06/05/23 1350  Vitals shown include unfiled device data.  Last Pain:  Vitals:   06/05/23 1257  TempSrc: Oral  PainSc: 0-No pain      Patients Stated Pain Goal: 8 (06/05/23 1257)  Complications: No notable events documented.

## 2023-06-05 NOTE — H&P (Signed)
H&P Update:  -History and Physical Reviewed  -Patient has been re-examined  -No change in the plan of care  -The risks and benefits were presented and reviewed. The risks due to suture failure and/or irritation, new/persistent infection, stiffness, nerve/vessel/tendon injury or rerupture of repaired tendon, nonunion/malunion, allograft usage, wound healing issues, development of arthritis, failure of this surgery, possibility of external fixation with delayed definitive surgery, need for further surgery, thromboembolic events, anesthesia/medical complications, amputation, death among others were discussed. The patient acknowledged the explanation, agreed to proceed with the plan and a consent was signed.  [name]

## 2023-06-05 NOTE — Anesthesia Preprocedure Evaluation (Addendum)
Anesthesia Evaluation  Patient identified by MRN, date of birth, ID band Patient awake    Reviewed: Allergy & Precautions, NPO status , Patient's Chart, lab work & pertinent test results  History of Anesthesia Complications Negative for: history of anesthetic complications  Airway Mallampati: II  TM Distance: >3 FB Neck ROM: Full    Dental no notable dental hx. (+) Teeth Intact, Dental Advisory Given   Pulmonary former smoker   Pulmonary exam normal breath sounds clear to auscultation       Cardiovascular hypertension, Pt. on medications Normal cardiovascular exam Rhythm:Regular Rate:Normal     Neuro/Psych negative neurological ROS     GI/Hepatic Neg liver ROS,GERD  ,,  Endo/Other  negative endocrine ROS    Renal/GU negative Renal ROS     Musculoskeletal  (+) Arthritis ,  Ulcer of left lower extremity   Abdominal   Peds  Hematology negative hematology ROS (+)   Anesthesia Other Findings Day of surgery medications reviewed with patient.  Reproductive/Obstetrics                             Anesthesia Physical Anesthesia Plan  ASA: 2  Anesthesia Plan: MAC   Post-op Pain Management: Tylenol PO (pre-op)*   Induction: Intravenous  PONV Risk Score and Plan: 3 and Treatment may vary due to age or medical condition, Ondansetron, Dexamethasone and Midazolam  Airway Management Planned: Natural Airway and Nasal Cannula  Additional Equipment: None  Intra-op Plan:   Post-operative Plan:   Informed Consent: I have reviewed the patients History and Physical, chart, labs and discussed the procedure including the risks, benefits and alternatives for the proposed anesthesia with the patient or authorized representative who has indicated his/her understanding and acceptance.     Dental advisory given  Plan Discussed with: CRNA and Surgeon  Anesthesia Plan Comments:          Anesthesia Quick Evaluation

## 2023-06-05 NOTE — Anesthesia Procedure Notes (Signed)
Procedure Name: MAC Date/Time: 06/05/2023 1:15 PM  Performed by: Yolanda Bonine, CRNAPre-anesthesia Checklist: Patient identified, Emergency Drugs available, Suction available, Patient being monitored and Timeout performed Patient Re-evaluated:Patient Re-evaluated prior to induction Oxygen Delivery Method: Simple face mask Airway Equipment and Method: Oral airway Placement Confirmation: positive ETCO2 Dental Injury: Teeth and Oropharynx as per pre-operative assessment

## 2023-06-05 NOTE — Anesthesia Postprocedure Evaluation (Signed)
Anesthesia Post Note  Patient: [name]  Procedure(s) Performed: LEFT LEG IRRIGATION AND DEBRIDEMENT WOUND (Left: Leg Lower)     Patient location during evaluation: PACU Anesthesia Type: MAC Level of consciousness: awake and alert Pain management: pain level controlled Vital Signs Assessment: post-procedure vital signs reviewed and stable Respiratory status: spontaneous breathing, nonlabored ventilation, respiratory function stable and patient connected to nasal cannula oxygen Cardiovascular status: stable and blood pressure returned to baseline Postop Assessment: no apparent nausea or vomiting Anesthetic complications: no  No notable events documented.  Last Vitals:  Vitals:   06/05/23 1257 06/05/23 1341  BP: 136/75   Pulse: (!) 59 65  Resp: 16 15  Temp: 36.6 C 36.6 C  SpO2: 100% 96%    Last Pain:  Vitals:   06/05/23 1341  TempSrc:   PainSc: 0-No pain    LLE Motor Response: Purposeful movement (06/05/23 1345)            Trevor Iha

## 2023-06-06 ENCOUNTER — Encounter (HOSPITAL_BASED_OUTPATIENT_CLINIC_OR_DEPARTMENT_OTHER): Payer: Self-pay | Admitting: Orthopaedic Surgery

## 2023-06-06 NOTE — Op Note (Signed)
06/05/2023  12:05 AM   PATIENT: [name]  68 y.o. female  MRN: [id number]   PRE-OPERATIVE DIAGNOSIS:   Hematoma of left lower extremity   POST-OPERATIVE DIAGNOSIS:   Hematoma of left lower extremity   PROCEDURE: Left leg incision and drainage of deep hematoma with complex closure of wound (3x3x2 cm)   SURGEON:  Netta Cedars, MD   ASSISTANT: None   ANESTHESIA: MAC, local   EBL: Minimal   TOURNIQUET:   None used   COMPLICATIONS: None apparent   DISPOSITION: Extubated, awake and stable to recovery.   INDICATION FOR PROCEDURE: The patient presented with above diagnosis.  We discussed the diagnosis, alternative treatment options, risks and benefits of the above surgical intervention, as well as alternative non-operative treatments. All questions/concerns were addressed and the patient/family demonstrated appropriate understanding of the diagnosis, the procedure, the postoperative course, and overall prognosis. The patient wished to proceed with surgical intervention and signed an informed surgical consent as such, in each others presence prior to surgery.   PROCEDURE IN DETAIL: After preoperative consent was obtained and the correct operative site was identified, the patient was brought to the operating room supine on stretcher and transferred onto operating table. General anesthesia was induced. Preoperative antibiotics were administered. Surgical timeout was taken. The patient was then positioned supine with an ipsilateral hip bump. The operative lower extremity was prepped and draped in standard sterile fashion.  We began by exploring the area of drainage in distal leg and there was noted to be an organized hematoma deep in this region. No signs of infection were encountered. The deep hematoma was evacuated and curettes used to debride the base.  The surgical site was thoroughly irrigated. Betadine and vancomycin powder were applied. The circular wound  was closed without tension using 2-0 prolene.    The leg was cleaned with saline and sterile dressings with gauze were applied. A well padded bulky sterile wrap was applied. The patient was awakened from anesthesia and transported to the recovery room in stable condition.    FOLLOW UP PLAN: -transfer to PACU, then home -WBAT operative extremity, maximum elevation -follow up as outpatient within 7-10 days for wound check -sutures out in 2-3 weeks in outpatient office   RADIOGRAPHS: None   Netta Cedars [facility]

## 2023-06-26 LAB — BASIC METABOLIC PANEL
BUN/Creatinine Ratio: 21 (ref 12–28)
BUN: 19 mg/dL (ref 8–27)
CO2: 25 mmol/L (ref 20–29)
Calcium: 9.8 mg/dL (ref 8.7–10.3)
Chloride: 100 mmol/L (ref 96–106)
Creatinine, Ser: 0.91 mg/dL (ref 0.57–1.00)
Glucose: 96 mg/dL (ref 70–99)
Potassium: 3.6 mmol/L (ref 3.5–5.2)
Sodium: 141 mmol/L (ref 134–144)
eGFR: 68 mL/min/{1.73_m2} (ref >59)

## 2023-06-28 DIAGNOSIS — Z96652 Presence of left artificial knee joint: Secondary | ICD-10-CM | POA: Diagnosis not present

## 2023-07-02 ENCOUNTER — Telehealth: Payer: Self-pay | Admitting: *Deleted

## 2023-07-02 NOTE — Telephone Encounter (Signed)
 Patient called in. She is scheduled for colonoscopy Friday with Dr. Jena Gauss. She had total knee replacement last march and the doctor that did the surgery has prescribed her an antibiotic Cephalexin prior to procedure. Patient wants to make sure she is fine to take this. Please advise Dr. Jena Gauss thanks!

## 2023-07-03 ENCOUNTER — Encounter (HOSPITAL_COMMUNITY): Payer: Self-pay

## 2023-07-03 ENCOUNTER — Other Ambulatory Visit: Payer: Self-pay

## 2023-07-03 ENCOUNTER — Encounter (HOSPITAL_COMMUNITY)
Admission: RE | Admit: 2023-07-03 | Discharge: 2023-07-03 | Disposition: A | Discharge status: Home / Self Care | Payer: Medicare PPO | Source: Ambulatory Visit | Attending: Internal Medicine | Admitting: Internal Medicine

## 2023-07-03 NOTE — Telephone Encounter (Signed)
Patient called in and is aware

## 2023-07-03 NOTE — Telephone Encounter (Signed)
 LMOVM to call back

## 2023-07-05 ENCOUNTER — Ambulatory Visit (HOSPITAL_COMMUNITY): Admitting: Certified Registered Nurse Anesthetist

## 2023-07-05 ENCOUNTER — Ambulatory Visit (HOSPITAL_COMMUNITY)
Admission: RE | Admit: 2023-07-05 | Discharge: 2023-07-05 | Disposition: A | Discharge status: Home / Self Care | Payer: Medicare PPO | Attending: Internal Medicine | Admitting: Internal Medicine

## 2023-07-05 ENCOUNTER — Encounter (HOSPITAL_COMMUNITY)
Admission: RE | Disposition: A | Discharge status: Home / Self Care | Payer: Self-pay | Source: Home / Self Care | Attending: Internal Medicine

## 2023-07-05 ENCOUNTER — Encounter (HOSPITAL_COMMUNITY): Payer: Self-pay | Admitting: Internal Medicine

## 2023-07-05 DIAGNOSIS — K5730 Diverticulosis of large intestine without perforation or abscess without bleeding: Secondary | ICD-10-CM | POA: Diagnosis not present

## 2023-07-05 DIAGNOSIS — I10 Essential (primary) hypertension: Secondary | ICD-10-CM | POA: Insufficient documentation

## 2023-07-05 DIAGNOSIS — D123 Benign neoplasm of transverse colon: Secondary | ICD-10-CM | POA: Diagnosis not present

## 2023-07-05 DIAGNOSIS — Z860100 Personal history of colon polyps, unspecified: Secondary | ICD-10-CM

## 2023-07-05 DIAGNOSIS — Z87891 Personal history of nicotine dependence: Secondary | ICD-10-CM | POA: Diagnosis not present

## 2023-07-05 DIAGNOSIS — Z1211 Encounter for screening for malignant neoplasm of colon: Secondary | ICD-10-CM

## 2023-07-05 DIAGNOSIS — K219 Gastro-esophageal reflux disease without esophagitis: Secondary | ICD-10-CM | POA: Insufficient documentation

## 2023-07-05 DIAGNOSIS — K635 Polyp of colon: Secondary | ICD-10-CM | POA: Diagnosis not present

## 2023-07-05 HISTORY — PX: COLONOSCOPY WITH PROPOFOL: SHX5780

## 2023-07-05 HISTORY — PX: POLYPECTOMY: SHX5525

## 2023-07-05 SURGERY — COLONOSCOPY WITH PROPOFOL
Anesthesia: General

## 2023-07-05 MED ORDER — LACTATED RINGERS IV SOLN: INTRAVENOUS | Status: DC

## 2023-07-05 MED ORDER — PROPOFOL 10 MG/ML IV BOLUS
INTRAVENOUS | Status: DC | PRN
Start: 2023-07-05 — End: 2023-07-05
  Administered 2023-07-05: 60 mg via INTRAVENOUS
  Administered 2023-07-05: 150 ug/kg/min via INTRAVENOUS

## 2023-07-05 MED ORDER — PROPOFOL 500 MG/50ML IV EMUL
INTRAVENOUS | Status: AC
  Filled 2023-07-05: qty 50

## 2023-07-05 NOTE — Discharge Instructions (Signed)
  Colonoscopy Discharge Instructions  Read the instructions outlined below and refer to this sheet in the next few weeks. These discharge instructions provide you with general information on caring for yourself after you leave the hospital. Your doctor may also give you specific instructions. While your treatment has been planned according to the most current medical practices available, unavoidable complications occasionally occur. If you have any problems or questions after discharge, call Dr. Jena Gauss at [phone]. ACTIVITY You may resume your regular activity, but move at a slower pace for the next 24 hours.  Take frequent rest periods for the next 24 hours.  Walking will help get rid of the air and reduce the bloated feeling in your belly (abdomen).  No driving for 24 hours (because of the medicine (anesthesia) used during the test).   Do not sign any important legal documents or operate any machinery for 24 hours (because of the anesthesia used during the test).  NUTRITION Drink plenty of fluids.  You may resume your normal diet as instructed by your doctor.  Begin with a light meal and progress to your normal diet. Heavy or fried foods are harder to digest and may make you feel sick to your stomach (nauseated).  Avoid alcoholic beverages for 24 hours or as instructed.  MEDICATIONS You may resume your normal medications unless your doctor tells you otherwise.  WHAT YOU CAN EXPECT TODAY Some feelings of bloating in the abdomen.  Passage of more gas than usual.  Spotting of blood in your stool or on the toilet paper.  IF YOU HAD POLYPS REMOVED DURING THE COLONOSCOPY: No aspirin products for 7 days or as instructed.  No alcohol for 7 days or as instructed.  Eat a soft diet for the next 24 hours.  FINDING OUT THE RESULTS OF YOUR TEST Not all test results are available during your visit. If your test results are not back during the visit, make an appointment with your caregiver to find out the  results. Do not assume everything is normal if you have not heard from your caregiver or the medical facility. It is important for you to follow up on all of your test results.  SEEK IMMEDIATE MEDICAL ATTENTION IF: You have more than a spotting of blood in your stool.  Your belly is swollen (abdominal distention).  You are nauseated or vomiting.  You have a temperature over 101.  You have abdominal pain or discomfort that is severe or gets worse throughout the day.    2 polyps found and removed  Colon polyp and diverticulosis information provided   further recommendations to follow pending review of pathology report   at patient request, I called Reid Nawrot [phone]-  call rolled to voicemail.  I left a message.

## 2023-07-05 NOTE — Anesthesia Postprocedure Evaluation (Signed)
 Anesthesia Post Note  Patient: [name]  Procedure(s) Performed: COLONOSCOPY WITH PROPOFOL POLYPECTOMY  Patient location during evaluation: Phase II Anesthesia Type: General Level of consciousness: awake Pain management: pain level controlled Vital Signs Assessment: post-procedure vital signs reviewed and stable Respiratory status: spontaneous breathing and respiratory function stable Cardiovascular status: blood pressure returned to baseline and stable Postop Assessment: no headache and no apparent nausea or vomiting Anesthetic complications: no Comments: Late entry   No notable events documented.   Last Vitals:  Vitals:   07/05/23 0940 07/05/23 1046  BP: 112/69 (!) 95/59  Pulse: 69 63  Resp: 19 14  Temp: 36.4 C (!) 36.4 C  SpO2: 92% 94%    Last Pain:  Vitals:   07/05/23 1046  TempSrc: Axillary  PainSc: 0-No pain                 Windell Norfolk

## 2023-07-05 NOTE — Op Note (Signed)
 [facility] Patient Name: [name] Procedure Date: 07/05/2023 9:59 AM MRN: [id number] Date of Birth: 1955/09/21 Attending MD: Gennette Pac , MD, [id number] CSN: [id number] Age: 68 Admit Type: Outpatient Procedure:                Colonoscopy Indications:              High risk colon cancer surveillance: Personal                            history of colonic polyps Providers:                Gennette Pac, MD, Crystal Page, Elinor Parkinson Referring MD:              Medicines:                Propofol per Anesthesia Complications:            No immediate complications. Estimated Blood Loss:     Estimated blood loss was minimal. Procedure:                Pre-Anesthesia Assessment:                           - Prior to the procedure, a History and Physical                            was performed, and patient medications and                            allergies were reviewed. The patient's tolerance of                            previous anesthesia was also reviewed. The risks                            and benefits of the procedure and the sedation                            options and risks were discussed with the patient.                            All questions were answered, and informed consent                            was obtained. Prior Anticoagulants: The patient has                            taken no anticoagulant or antiplatelet agents. ASA                            Grade Assessment: III - A patient with severe  systemic disease. After reviewing the risks and                            benefits, the patient was deemed in satisfactory                            condition to undergo the procedure.                           After obtaining informed consent, the colonoscope                            was passed under direct vision. Throughout the                            procedure, the patient's  blood pressure, pulse, and                            oxygen saturations were monitored continuously. The                            [id number]) scope was introduced through the                            anus and advanced to the the cecum, identified by                            appendiceal orifice and ileocecal valve. The                            colonoscopy was performed without difficulty. The                            patient tolerated the procedure well. The quality                            of the bowel preparation was adequate. The                            colonoscopy was performed without difficulty. The                            patient tolerated the procedure well. The entire                            colon was well visualized. Scope In: 10:27:35 AM Scope Out: 10:40:08 AM Scope Withdrawal Time: 0 hours 9 minutes 28 seconds  Total Procedure Duration: 0 hours 12 minutes 33 seconds  Findings:      The perianal and digital rectal examinations were normal.      Scattered medium-mouthed diverticula were found in the entire colon.      Two sessile polyps were found in the hepatic flexure. The polyps were 4       to 5 mm in size. These polyps were removed with a cold  snare. Resection       and retrieval were complete. Estimated blood loss was minimal.      The exam was otherwise without abnormality on direct and retroflexion       views. Impression:               - Diverticulosis in the entire examined colon.                           - Two 4 to 5 mm polyps at the hepatic flexure,                            removed with a cold snare. Resected and retrieved.                           - The examination was otherwise normal on direct                            and retroflexion views. Moderate Sedation:      Moderate (conscious) sedation was personally administered by an       anesthesia professional. The following parameters were monitored: oxygen       saturation,  heart rate, blood pressure, respiratory rate, EKG, adequacy       of pulmonary ventilation, and response to care. Recommendation:           - Patient has a contact number available for                            emergencies. The signs and symptoms of potential                            delayed complications were discussed with the                            patient. Return to normal activities tomorrow.                            Written discharge instructions were provided to the                            patient.                           - Advance diet as tolerated.                           - Continue present medications.                           - Repeat colonoscopy date to be determined after                            pending pathology results are reviewed for                            surveillance based on pathology results.                           -  Return to GI office (date not yet determined). Procedure Code(s):        --- Professional ---                           [phone], Colonoscopy, flexible; with removal of                            tumor(s), polyp(s), or other lesion(s) by snare                            technique Diagnosis Code(s):        --- Professional ---                           Z86.010, Personal history of colonic polyps                           D12.3, Benign neoplasm of transverse colon (hepatic                            flexure or splenic flexure)                           K57.30, Diverticulosis of large intestine without                            perforation or abscess without bleeding CPT copyright 2022 American Medical Association. All rights reserved. The codes documented in this report are preliminary and upon coder review may  be revised to meet current compliance requirements. Gerrit Friends. Macoy Rodwell, MD Gennette Pac, MD 07/05/2023 10:48:58 AM This report has been signed electronically. Number of Addenda: 0

## 2023-07-05 NOTE — Anesthesia Preprocedure Evaluation (Signed)
 Anesthesia Evaluation  Patient identified by MRN, date of birth, ID band Patient awake    Reviewed: Allergy & Precautions, H&P , NPO status , Patient's Chart, lab work & pertinent test results, reviewed documented beta blocker date and time   Airway Mallampati: II  TM Distance: >3 FB Neck ROM: full    Dental no notable dental hx.    Pulmonary neg pulmonary ROS, pneumonia, former smoker   Pulmonary exam normal breath sounds clear to auscultation       Cardiovascular Exercise Tolerance: Good hypertension, negative cardio ROS  Rhythm:regular Rate:Normal     Neuro/Psych negative neurological ROS  negative psych ROS   GI/Hepatic negative GI ROS, Neg liver ROS,GERD  ,,  Endo/Other  negative endocrine ROS    Renal/GU negative Renal ROS  negative genitourinary   Musculoskeletal   Abdominal   Peds  Hematology negative hematology ROS (+)   Anesthesia Other Findings   Reproductive/Obstetrics negative OB ROS                             Anesthesia Physical Anesthesia Plan  ASA: 2  Anesthesia Plan: General   Post-op Pain Management:    Induction:   PONV Risk Score and Plan: Propofol infusion  Airway Management Planned:   Additional Equipment:   Intra-op Plan:   Post-operative Plan:   Informed Consent: I have reviewed the patients History and Physical, chart, labs and discussed the procedure including the risks, benefits and alternatives for the proposed anesthesia with the patient or authorized representative who has indicated his/her understanding and acceptance.     Dental Advisory Given  Plan Discussed with: CRNA  Anesthesia Plan Comments:        Anesthesia Quick Evaluation

## 2023-07-05 NOTE — H&P (Signed)
 @LOGO @   Primary Care Physician:  Assunta Found, MD Primary Gastroenterologist:  Dr. Jena Gauss  Pre-Procedure History & Physical: HPI:  [name] is a 68 y.o. female here for surveillance colonoscopy.  History of colonic polyps removed the distant past.  Mother with colonic polyps no polyps found 2019; she is here for surveillance examination.  Past Medical History:  Diagnosis Date   Arthritis    lt knee   GERD (gastroesophageal reflux disease)    Hypercholesteremia    Hypertension    Neuropathy    Osteomyelitis (HCC)    left 2nd toe   Pneumonia    as a child   Pre-diabetes    Ulcer of left lower extremity (HCC)     Past Surgical History:  Procedure Laterality Date   ABDOMINAL HYSTERECTOMY     AMPUTATION TOE Left 01/11/2022   Procedure: Left 2nd toe partial amputation;  Surgeon: Netta Cedars, MD;  Location: [facility];  Service: Orthopedics;  Laterality: Left;  60 min   BIOPSY  03/13/2018   Procedure: BIOPSY;  Surgeon: Corbin Ade, MD;  Location: [facility];  Service: Endoscopy;;  (colon)   BLADDER SUSPENSION     COLONOSCOPY WITH PROPOFOL N/A 03/13/2018   Procedure: COLONOSCOPY WITH PROPOFOL;  Surgeon: Corbin Ade, MD;  Location: [facility];  Service: Endoscopy;  Laterality: N/A;  1:00pm-rescheduled to 11/21 @ 1:30pm per Mindy   INCISION AND DRAINAGE OF WOUND Left 06/05/2023   Procedure: LEFT LEG IRRIGATION AND DEBRIDEMENT WOUND;  Surgeon: Netta Cedars, MD;  Location: [facility];  Service: Orthopedics;  Laterality: Left;   TOTAL KNEE ARTHROPLASTY Left 06/28/2022   Procedure: TOTAL KNEE ARTHROPLASTY;  Surgeon: Durene Romans, MD;  Location: [facility];  Service: Orthopedics;  Laterality: Left;    Prior to Admission medications   Medication Sig Start Date End Date Taking? Authorizing Provider  amLODipine (NORVASC) 5 MG tablet Take 5 mg by mouth at bedtime.   Yes [provider]  atorvastatin (LIPITOR) 20 MG tablet Take  20 mg by mouth daily.   Yes [provider]  B Complex-C-Folic Acid (SUPER B COMPLEX/FA/VIT C PO) Take by mouth.   Yes [provider]  Calcium Citrate-Vitamin D (CITRACAL PETITES/VITAMIN D PO) Take 2 tablets by mouth 2 (two) times daily.    Yes [provider]  celecoxib (CELEBREX) 200 MG capsule Take 400 mg by mouth daily.   Yes [provider]  Cholecalciferol (VITAMIN D3) 250 MCG (10000 UT) capsule Take 10,000 Units by mouth in the morning.   Yes [provider]  cyanocobalamin (VITAMIN B12) 1000 MCG tablet Take 1,000 mcg by mouth daily.   Yes [provider]  docusate sodium (COLACE) 100 MG capsule Take 100 mg by mouth daily as needed for mild constipation.   Yes [provider]  doxycycline (VIBRAMYCIN) 100 MG capsule Take 100 mg by mouth 2 (two) times daily.   Yes [provider]  gabapentin (NEURONTIN) 300 MG capsule Take 600 mg by mouth at bedtime. 01/17/18  Yes [provider]  Multiple Vitamin (MULTIVITAMIN) tablet Take 1 tablet by mouth daily. Centrum silver Woman   Yes [provider]  mupirocin ointment (BACTROBAN) 2 % 1 Application 2 (two) times daily.   Yes [provider]  olmesartan-hydrochlorothiazide (BENICAR HCT) 40-25 MG tablet Take 1 tablet by mouth in the morning.   Yes [provider]  Omega-3 Fatty Acids (FISH OIL) 1000 MG CAPS Take 1,000 mg by  mouth daily.   Yes [provider]  Probiotic Product (PROBIOTIC PO) Take 1 capsule by mouth in the morning.   Yes [provider]    Allergies as of 05/16/2023 - Review Complete 04/01/2023  Allergen Reaction Noted   Wound dressing adhesive Rash 06/25/2022    History reviewed. No pertinent family history.  Social History   Socioeconomic History   Marital status: Widowed    Spouse name: Not on file   Number of children: Not on file   Years of education: Not on file   Highest education level: Not on  file  Occupational History   Not on file  Tobacco Use   Smoking status: Former   Smokeless tobacco: Never  Vaping Use   Vaping status: Never Used  Substance and Sexual Activity   Alcohol use: Never   Drug use: Never   Sexual activity: Not Currently    Birth control/protection: Surgical    Comment: hyst  Other Topics Concern   Not on file  Social History Narrative   Not on file   Social Drivers of Health   Financial Resource Strain: Not on file  Food Insecurity: No Food Insecurity (06/28/2022)   Hunger Vital Sign    Worried About Running Out of Food in the Last Year: Never true    Ran Out of Food in the Last Year: Never true  Transportation Needs: No Transportation Needs (06/28/2022)   PRAPARE - Administrator, Civil Service (Medical): No    Lack of Transportation (Non-Medical): No  Physical Activity: Not on file  Stress: Not on file  Social Connections: Not on file  Intimate Partner Violence: Not At Risk (06/28/2022)   Humiliation, Afraid, Rape, and Kick questionnaire    Fear of Current or Ex-Partner: No    Emotionally Abused: No    Physically Abused: No    Sexually Abused: No    Review of Systems: See HPI, otherwise negative ROS  Physical Exam: BP 112/69 (BP Location: Right Arm)   Pulse 69   Temp 97.6 F (36.4 C)   Resp 19   Ht 5\' 5"  (1.651 m)   Wt 88.5 kg   SpO2 92%   BMI 32.47 kg/m  General:   Alert,  Well-developed, well-nourished, pleasant and cooperative in NAD Heart:  Regular rate and rhythm; no murmurs, clicks, rubs,  or gallops. Abdomen: Non-distended, normal bowel sounds.  Soft and nontender without appreciable mass or hepatosplenomegaly.    Impression/Plan: 68 year old lady with a family history of colonic polyps and a personal history of colonic polyps here for surveillance colonoscopy. The risks, benefits, limitations, alternatives and imponderables have been reviewed with the patient. Questions have been answered. All parties are  agreeable.       Notice: This dictation was prepared with Dragon dictation along with smaller phrase technology. Any transcriptional errors that result from this process are unintentional and may not be corrected upon review.

## 2023-07-05 NOTE — Transfer of Care (Signed)
 Immediate Anesthesia Transfer of Care Note  Patient: [name]  Procedure(s) Performed: COLONOSCOPY WITH PROPOFOL POLYPECTOMY  Patient Location: Short Stay  Anesthesia Type:General  Level of Consciousness: awake, alert , and oriented  Airway & Oxygen Therapy: Patient Spontanous Breathing  Post-op Assessment: Report given to RN, Post -op Vital signs reviewed and stable, Patient moving all extremities X 4, and Patient able to stick tongue midline  Post vital signs: Reviewed and stable  Last Vitals:  Vitals Value Taken Time  BP 103/43 07/05/23 1045  Temp 97.5   Pulse 63 07/05/23 1046  Resp 14 07/05/23 1046  SpO2 94 % 07/05/23 1046  Vitals shown include unfiled device data.  Last Pain:  Vitals:   07/05/23 1023  PainSc: 0-No pain         Complications: No notable events documented.

## 2023-07-08 ENCOUNTER — Encounter (HOSPITAL_COMMUNITY): Payer: Self-pay | Admitting: Internal Medicine

## 2023-07-08 ENCOUNTER — Encounter: Payer: Self-pay | Admitting: Internal Medicine

## 2023-07-08 LAB — SURGICAL PATHOLOGY

## 2023-08-05 ENCOUNTER — Ambulatory Visit (HOSPITAL_COMMUNITY)
Admission: RE | Admit: 2023-08-05 | Discharge: 2023-08-05 | Disposition: A | Discharge status: Home / Self Care | Source: Ambulatory Visit | Attending: Orthopaedic Surgery | Admitting: Orthopaedic Surgery

## 2023-08-05 ENCOUNTER — Other Ambulatory Visit (HOSPITAL_COMMUNITY): Payer: Self-pay | Admitting: Orthopaedic Surgery

## 2023-08-05 DIAGNOSIS — L97909 Non-pressure chronic ulcer of unspecified part of unspecified lower leg with unspecified severity: Secondary | ICD-10-CM

## 2023-08-05 DIAGNOSIS — M19072 Primary osteoarthritis, left ankle and foot: Secondary | ICD-10-CM | POA: Diagnosis not present

## 2023-08-05 DIAGNOSIS — R600 Localized edema: Secondary | ICD-10-CM | POA: Diagnosis not present

## 2023-08-05 MED ORDER — GADOBUTROL 1 MMOL/ML IV SOLN
9.0000 mL | Freq: Once | INTRAVENOUS | Status: AC | PRN
  Administered 2023-08-05: 9 mL via INTRAVENOUS

## 2023-08-09 ENCOUNTER — Ambulatory Visit (HOSPITAL_COMMUNITY)
Admission: RE | Admit: 2023-08-09 | Discharge: 2023-08-09 | Disposition: A | Discharge status: Home / Self Care | Source: Ambulatory Visit | Attending: Vascular Surgery | Admitting: Vascular Surgery

## 2023-08-09 ENCOUNTER — Other Ambulatory Visit (HOSPITAL_COMMUNITY): Payer: Self-pay | Admitting: Orthopaedic Surgery

## 2023-08-09 DIAGNOSIS — L97302 Non-pressure chronic ulcer of unspecified ankle with fat layer exposed: Secondary | ICD-10-CM | POA: Diagnosis not present

## 2023-08-09 DIAGNOSIS — M79671 Pain in right foot: Secondary | ICD-10-CM | POA: Diagnosis not present

## 2023-08-09 LAB — VAS US ABI WITH/WO TBI
Left ABI: 0.96
Right ABI: 0.95

## 2023-08-19 ENCOUNTER — Encounter (HOSPITAL_BASED_OUTPATIENT_CLINIC_OR_DEPARTMENT_OTHER): Attending: Internal Medicine | Admitting: Internal Medicine

## 2023-08-19 DIAGNOSIS — I87332 Chronic venous hypertension (idiopathic) with ulcer and inflammation of left lower extremity: Secondary | ICD-10-CM | POA: Insufficient documentation

## 2023-08-19 DIAGNOSIS — L97828 Non-pressure chronic ulcer of other part of left lower leg with other specified severity: Secondary | ICD-10-CM | POA: Insufficient documentation

## 2023-08-19 DIAGNOSIS — Z96652 Presence of left artificial knee joint: Secondary | ICD-10-CM | POA: Diagnosis not present

## 2023-08-19 DIAGNOSIS — Z89422 Acquired absence of other left toe(s): Secondary | ICD-10-CM | POA: Insufficient documentation

## 2023-08-19 DIAGNOSIS — I10 Essential (primary) hypertension: Secondary | ICD-10-CM | POA: Insufficient documentation

## 2023-08-19 DIAGNOSIS — I890 Lymphedema, not elsewhere classified: Secondary | ICD-10-CM | POA: Diagnosis not present

## 2023-08-19 DIAGNOSIS — X58XXXA Exposure to other specified factors, initial encounter: Secondary | ICD-10-CM | POA: Diagnosis not present

## 2023-08-19 DIAGNOSIS — S92901A Unspecified fracture of right foot, initial encounter for closed fracture: Secondary | ICD-10-CM | POA: Diagnosis not present

## 2023-08-19 DIAGNOSIS — L97822 Non-pressure chronic ulcer of other part of left lower leg with fat layer exposed: Secondary | ICD-10-CM | POA: Diagnosis not present

## 2023-08-26 ENCOUNTER — Other Ambulatory Visit (HOSPITAL_COMMUNITY): Payer: Self-pay | Admitting: Orthopaedic Surgery

## 2023-08-26 DIAGNOSIS — S92324A Nondisplaced fracture of second metatarsal bone, right foot, initial encounter for closed fracture: Secondary | ICD-10-CM

## 2023-08-27 ENCOUNTER — Ambulatory Visit (HOSPITAL_COMMUNITY)
Admission: RE | Admit: 2023-08-27 | Discharge: 2023-08-27 | Disposition: A | Discharge status: Home / Self Care | Source: Ambulatory Visit | Attending: Orthopaedic Surgery | Admitting: Orthopaedic Surgery

## 2023-08-27 ENCOUNTER — Encounter (HOSPITAL_BASED_OUTPATIENT_CLINIC_OR_DEPARTMENT_OTHER): Attending: Internal Medicine | Admitting: Internal Medicine

## 2023-08-27 DIAGNOSIS — M7731 Calcaneal spur, right foot: Secondary | ICD-10-CM | POA: Diagnosis not present

## 2023-08-27 DIAGNOSIS — S92324A Nondisplaced fracture of second metatarsal bone, right foot, initial encounter for closed fracture: Secondary | ICD-10-CM | POA: Insufficient documentation

## 2023-08-27 DIAGNOSIS — I890 Lymphedema, not elsewhere classified: Secondary | ICD-10-CM | POA: Diagnosis not present

## 2023-08-27 DIAGNOSIS — L97828 Non-pressure chronic ulcer of other part of left lower leg with other specified severity: Secondary | ICD-10-CM | POA: Diagnosis not present

## 2023-08-27 DIAGNOSIS — I87332 Chronic venous hypertension (idiopathic) with ulcer and inflammation of left lower extremity: Secondary | ICD-10-CM | POA: Insufficient documentation

## 2023-08-27 DIAGNOSIS — M19071 Primary osteoarthritis, right ankle and foot: Secondary | ICD-10-CM | POA: Diagnosis not present

## 2023-08-28 ENCOUNTER — Other Ambulatory Visit: Payer: Self-pay

## 2023-08-28 ENCOUNTER — Encounter (HOSPITAL_BASED_OUTPATIENT_CLINIC_OR_DEPARTMENT_OTHER): Payer: Self-pay | Admitting: Orthopaedic Surgery

## 2023-08-30 ENCOUNTER — Encounter (HOSPITAL_BASED_OUTPATIENT_CLINIC_OR_DEPARTMENT_OTHER): Admitting: Internal Medicine

## 2023-09-01 NOTE — Discharge Instructions (Signed)
 Netta Cedars, MD [facility]  Please read the following information regarding your care after surgery.  Medications  You only need a prescription for the narcotic pain medicine (ex. oxycodone, Percocet, Norco).  All of the other medicines listed below are available over the counter. ? Aleve 2 pills twice a day for the first 3 days after surgery. ? acetominophen (Tylenol) 650 mg every 4-6 hours as you need for minor to moderate pain ? oxycodone as prescribed for severe pain  ? To help prevent blood clots, take aspirin (81 mg) twice daily for at least 42 days after surgery.  You should also get up every hour while you are awake to move around.  Weight Bearing ? Do NOT bear any weight on the operated leg or foot. This means do NOT touch your surgical leg to the ground!  Cast / Splint / Dressing ? If you have a splint, do NOT remove this. Keep your splint, cast or dressing clean and dry.  Don't put anything (coat hanger, pencil, etc) down inside of it.  If it gets wet, call the office immediately to schedule an appointment for a cast change.  Swelling IMPORTANT: It is normal for you to have swelling where you had surgery. To reduce swelling and pain, keep at least 3 pillows under your leg so that your toes are above your nose and your heel is above the level of your hip.  It may be necessary to keep your foot or leg elevated for several weeks.  This is critical to helping your incisions heal and your pain to feel better.  Follow Up Call my office at [phone] when you are discharged from the hospital or surgery center to schedule an appointment to be seen 7-10 days after surgery.  Call my office at [phone] if you develop a fever >101.5 F, nausea, vomiting, bleeding from the surgical site or severe pain.     Post Anesthesia Home Care Instructions  Activity: Get plenty of rest for the remainder of the day. A responsible individual must stay with you for 24 hours following the  procedure.  For the next 24 hours, DO NOT: -Drive a car -Advertising copywriter -Drink alcoholic beverages -Take any medication unless instructed by your physician -Make any legal decisions or sign important papers.  Meals: Start with liquid foods such as gelatin or soup. Progress to regular foods as tolerated. Avoid greasy, spicy, heavy foods. If nausea and/or vomiting occur, drink only clear liquids until the nausea and/or vomiting subsides. Call your physician if vomiting continues.  Special Instructions/Symptoms: Your throat may feel dry or sore from the anesthesia or the breathing tube placed in your throat during surgery. If this causes discomfort, gargle with warm salt water. The discomfort should disappear within 24 hours.  If you had a scopolamine patch placed behind your ear for the management of post- operative nausea and/or vomiting:  1. The medication in the patch is effective for 72 hours, after which it should be removed.  Wrap patch in a tissue and discard in the trash. Wash hands thoroughly with soap and water. 2. You may remove the patch earlier than 72 hours if you experience unpleasant side effects which may include dry mouth, dizziness or visual disturbances. 3. Avoid touching the patch. Wash your hands with soap and water after contact with the patch.  Regional Anesthesia Blocks  1. You may not be able to move or feel the "blocked" extremity after a regional anesthetic block. This may last may last  from 3-48 hours after placement, but it will go away. The length of time depends on the medication injected and your individual response to the medication. As the nerves start to wake up, you may experience tingling as the movement and feeling returns to your extremity. If the numbness and inability to move your extremity has not gone away after 48 hours, please call your surgeon.   2. The extremity that is blocked will need to be protected until the numbness is gone and the  strength has returned. Because you cannot feel it, you will need to take extra care to avoid injury. Because it may be weak, you may have difficulty moving it or using it. You may not know what position it is in without looking at it while the block is in effect.  3. For blocks in the legs and feet, returning to weight bearing and walking needs to be done carefully. You will need to wait until the numbness is entirely gone and the strength has returned. You should be able to move your leg and foot normally before you try and bear weight or walk. You will need someone to be with you when you first try to ensure you do not fall and possibly risk injury.  4. Bruising and tenderness at the needle site are common side effects and will resolve in a few days.  5. Persistent numbness or new problems with movement should be communicated to the surgeon or the [facility] [phone] [facility] [phone]).

## 2023-09-01 NOTE — H&P (Signed)
 ORTHOPAEDIC SURGERY H&P  Subjective:  The patient presents with right midfoot Lisfranc fracture injury.   Past Medical History:  Diagnosis Date   Arthritis    lt knee   GERD (gastroesophageal reflux disease)    Hypercholesteremia    Hypertension    Neuropathy    Osteomyelitis (HCC)    left 2nd toe   Pneumonia    as a child   Pre-diabetes    Ulcer of left lower extremity (HCC)     Past Surgical History:  Procedure Laterality Date   ABDOMINAL HYSTERECTOMY     AMPUTATION TOE Left 01/11/2022   Procedure: Left 2nd toe partial amputation;  Surgeon: Ali Ink, MD;  Location: [facility];  Service: Orthopedics;  Laterality: Left;  60 min   BIOPSY  03/13/2018   Procedure: BIOPSY;  Surgeon: Suzette Espy, MD;  Location: [facility];  Service: Endoscopy;;  (colon)   BLADDER SUSPENSION     COLONOSCOPY WITH PROPOFOL  N/A 03/13/2018   Procedure: COLONOSCOPY WITH PROPOFOL ;  Surgeon: Suzette Espy, MD;  Location: [facility];  Service: Endoscopy;  Laterality: N/A;  1:00pm-rescheduled to 11/21 @ 1:30pm per Mindy   COLONOSCOPY WITH PROPOFOL  N/A 07/05/2023   Procedure: COLONOSCOPY WITH PROPOFOL ;  Surgeon: Suzette Espy, MD;  Location: [facility];  Service: Endoscopy;  Laterality: N/A;  11:15 am, asa 2   INCISION AND DRAINAGE OF WOUND Left 06/05/2023   Procedure: LEFT LEG IRRIGATION AND DEBRIDEMENT WOUND;  Surgeon: Ali Ink, MD;  Location:  S[facility]ERY [facility];  Service: Orthopedics;  Laterality: Left;   POLYPECTOMY  07/05/2023   Procedure: POLYPECTOMY;  Surgeon: Suzette Espy, MD;  Location: [facility];  Service: Endoscopy;;   TOTAL KNEE ARTHROPLASTY Left 06/28/2022   Procedure: TOTAL KNEE ARTHROPLASTY;  Surgeon: Claiborne Crew, MD;  Location: [facility];  Service: Orthopedics;  Laterality: Left;     (Not in an outpatient encounter)    Allergies  Allergen Reactions   Wound Dressing Adhesive Rash    Social History   Socioeconomic History    Marital status: Widowed    Spouse name: Not on file   Number of children: Not on file   Years of education: Not on file   Highest education level: Not on file  Occupational History   Not on file  Tobacco Use   Smoking status: Former   Smokeless tobacco: Never  Vaping Use   Vaping status: Never Used  Substance and Sexual Activity   Alcohol use: Never   Drug use: Never   Sexual activity: Not Currently    Birth control/protection: Surgical    Comment: hyst  Other Topics Concern   Not on file  Social History Narrative   Not on file   Social Drivers of Health   Financial Resource Strain: Not on file  Food Insecurity: No Food Insecurity (06/28/2022)   Hunger Vital Sign    Worried About Running Out of Food in the Last Year: Never true    Ran Out of Food in the Last Year: Never true  Transportation Needs: No Transportation Needs (06/28/2022)   PRAPARE - Administrator, Civil Service (Medical): No    Lack of Transportation (Non-Medical): No  Physical Activity: Not on file  Stress: Not on file  Social Connections: Not on file  Intimate Partner Violence: Not At Risk (06/28/2022)   Humiliation, Afraid, Rape, and Kick questionnaire    Fear of Current or Ex-Partner: No    Emotionally Abused:  No    Physically Abused: No    Sexually Abused: No     History reviewed. No pertinent family history.   Review of Systems Pertinent items are noted in HPI.  Objective: Vital signs in last 24 hours:    08/28/2023    4:46 PM 08/05/2023    4:00 PM 07/05/2023   10:46 AM  Vitals with BMI  Height 5\' 5"  5\' 5"    Weight 195 lbs 5 oz 195 lbs   BMI 32.5 32.45   Systolic   95  Diastolic   59  Pulse   63      EXAM: General: Well nourished, well developed. Awake, alert and oriented to time, place, person. Normal mood and affect. No apparent distress. Breathing room air.  Operative Lower Extremity: Alignment - Neutral Deformity - None Skin intact Tenderness to palpation - right  midfoot 5/5 TA, PT, GS, Per, EHL, FHL Sensation intact to light touch throughout Palpable DP and PT pulses Special testing: None  The contralateral foot/ankle was examined for comparison and noted to be neurovascularly intact with no localized deformity, swelling, or tenderness.  Imaging Review All images taken were independently reviewed by me.  Assessment/Plan: The clinical and radiographic findings were reviewed and discussed at length with the patient.  The patient has right midfoot Lisfranc fracture injury.  We spoke at length about the natural course of these findings. We discussed nonoperative and operative treatment options in detail.  The risks and benefits were presented and reviewed. The risks due to eventual hardware elective removal, hardware failure/irritation, new/persistent/recurrent infection, stiffness, nerve/vessel/tendon injury, nonunion/malunion of any fracture, wound healing issues, allograft usage, development of arthritis, failure of this surgery, possibility of external fixation in certain situations, possibility of delayed definitive surgery, need for further surgery, prolonged wound care including further soft tissue coverage procedures, thromboembolic events, anesthesia/medical complications/events perioperatively and beyond, amputation, death among others were discussed. The patient acknowledged the explanation and agreed to proceed with the plan.  Ali Ink  Orthopaedic Surgery [facility]

## 2023-09-02 ENCOUNTER — Encounter (HOSPITAL_BASED_OUTPATIENT_CLINIC_OR_DEPARTMENT_OTHER): Admitting: Internal Medicine

## 2023-09-02 ENCOUNTER — Encounter (HOSPITAL_BASED_OUTPATIENT_CLINIC_OR_DEPARTMENT_OTHER)
Admission: RE | Admit: 2023-09-02 | Discharge: 2023-09-02 | Disposition: A | Discharge status: Home / Self Care | Source: Ambulatory Visit | Attending: Orthopaedic Surgery | Admitting: Orthopaedic Surgery

## 2023-09-02 DIAGNOSIS — L97828 Non-pressure chronic ulcer of other part of left lower leg with other specified severity: Secondary | ICD-10-CM | POA: Diagnosis not present

## 2023-09-02 DIAGNOSIS — I87332 Chronic venous hypertension (idiopathic) with ulcer and inflammation of left lower extremity: Secondary | ICD-10-CM | POA: Diagnosis not present

## 2023-09-02 DIAGNOSIS — Z01812 Encounter for preprocedural laboratory examination: Secondary | ICD-10-CM | POA: Diagnosis not present

## 2023-09-02 DIAGNOSIS — I10 Essential (primary) hypertension: Secondary | ICD-10-CM | POA: Insufficient documentation

## 2023-09-02 DIAGNOSIS — I890 Lymphedema, not elsewhere classified: Secondary | ICD-10-CM | POA: Diagnosis not present

## 2023-09-02 LAB — BASIC METABOLIC PANEL WITH GFR
Anion gap: 11 (ref 5–15)
BUN: 19 mg/dL (ref 8–23)
CO2: 27 mmol/L (ref 22–32)
Calcium: 9.5 mg/dL (ref 8.9–10.3)
Chloride: 102 mmol/L (ref 98–111)
Creatinine, Ser: 1.04 mg/dL — ABNORMAL HIGH (ref 0.44–1.00)
GFR, Estimated: 58 mL/min — ABNORMAL LOW (ref >60)
Glucose, Bld: 111 mg/dL — ABNORMAL HIGH (ref 70–99)
Potassium: 4 mmol/L (ref 3.5–5.1)
Sodium: 140 mmol/L (ref 135–145)

## 2023-09-04 ENCOUNTER — Ambulatory Visit (HOSPITAL_COMMUNITY)

## 2023-09-04 ENCOUNTER — Ambulatory Visit (HOSPITAL_BASED_OUTPATIENT_CLINIC_OR_DEPARTMENT_OTHER): Admitting: Anesthesiology

## 2023-09-04 ENCOUNTER — Ambulatory Visit (HOSPITAL_BASED_OUTPATIENT_CLINIC_OR_DEPARTMENT_OTHER)
Admission: RE | Admit: 2023-09-04 | Discharge: 2023-09-04 | Disposition: A | Discharge status: Home / Self Care | Attending: Orthopaedic Surgery | Admitting: Orthopaedic Surgery

## 2023-09-04 ENCOUNTER — Other Ambulatory Visit: Payer: Self-pay

## 2023-09-04 ENCOUNTER — Encounter (HOSPITAL_BASED_OUTPATIENT_CLINIC_OR_DEPARTMENT_OTHER): Payer: Self-pay | Admitting: Orthopaedic Surgery

## 2023-09-04 ENCOUNTER — Encounter (HOSPITAL_BASED_OUTPATIENT_CLINIC_OR_DEPARTMENT_OTHER)
Admission: RE | Disposition: A | Discharge status: Home / Self Care | Payer: Self-pay | Source: Home / Self Care | Attending: Orthopaedic Surgery

## 2023-09-04 DIAGNOSIS — S92341A Displaced fracture of fourth metatarsal bone, right foot, initial encounter for closed fracture: Secondary | ICD-10-CM | POA: Insufficient documentation

## 2023-09-04 DIAGNOSIS — Z6833 Body mass index (BMI) 33.0-33.9, adult: Secondary | ICD-10-CM | POA: Insufficient documentation

## 2023-09-04 DIAGNOSIS — S92331A Displaced fracture of third metatarsal bone, right foot, initial encounter for closed fracture: Secondary | ICD-10-CM | POA: Insufficient documentation

## 2023-09-04 DIAGNOSIS — S92324A Nondisplaced fracture of second metatarsal bone, right foot, initial encounter for closed fracture: Secondary | ICD-10-CM

## 2023-09-04 DIAGNOSIS — S93324A Dislocation of tarsometatarsal joint of right foot, initial encounter: Secondary | ICD-10-CM | POA: Insufficient documentation

## 2023-09-04 DIAGNOSIS — K219 Gastro-esophageal reflux disease without esophagitis: Secondary | ICD-10-CM | POA: Diagnosis not present

## 2023-09-04 DIAGNOSIS — X58XXXA Exposure to other specified factors, initial encounter: Secondary | ICD-10-CM | POA: Diagnosis not present

## 2023-09-04 DIAGNOSIS — Z87891 Personal history of nicotine dependence: Secondary | ICD-10-CM | POA: Insufficient documentation

## 2023-09-04 DIAGNOSIS — I10 Essential (primary) hypertension: Secondary | ICD-10-CM | POA: Insufficient documentation

## 2023-09-04 DIAGNOSIS — G8918 Other acute postprocedural pain: Secondary | ICD-10-CM | POA: Diagnosis not present

## 2023-09-04 DIAGNOSIS — S92334A Nondisplaced fracture of third metatarsal bone, right foot, initial encounter for closed fracture: Secondary | ICD-10-CM | POA: Diagnosis not present

## 2023-09-04 DIAGNOSIS — M1990 Unspecified osteoarthritis, unspecified site: Secondary | ICD-10-CM | POA: Diagnosis not present

## 2023-09-04 DIAGNOSIS — E669 Obesity, unspecified: Secondary | ICD-10-CM | POA: Insufficient documentation

## 2023-09-04 DIAGNOSIS — S92321A Displaced fracture of second metatarsal bone, right foot, initial encounter for closed fracture: Secondary | ICD-10-CM | POA: Insufficient documentation

## 2023-09-04 DIAGNOSIS — S93314A Dislocation of tarsal joint of right foot, initial encounter: Secondary | ICD-10-CM | POA: Diagnosis not present

## 2023-09-04 DIAGNOSIS — S92344A Nondisplaced fracture of fourth metatarsal bone, right foot, initial encounter for closed fracture: Secondary | ICD-10-CM | POA: Diagnosis not present

## 2023-09-04 DIAGNOSIS — Z0189 Encounter for other specified special examinations: Secondary | ICD-10-CM | POA: Diagnosis not present

## 2023-09-04 HISTORY — PX: OPEN REDUCTION INTERNAL FIXATION (ORIF) FOOT LISFRANC FRACTURE: SHX5990

## 2023-09-04 SURGERY — OPEN REDUCTION INTERNAL FIXATION (ORIF) FOOT LISFRANC FRACTURE
Anesthesia: General | Site: Foot | Laterality: Right

## 2023-09-04 MED ORDER — CEFAZOLIN SODIUM-DEXTROSE 2-4 GM/100ML-% IV SOLN
INTRAVENOUS | Status: AC
  Filled 2023-09-04: qty 100

## 2023-09-04 MED ORDER — FENTANYL CITRATE (PF) 100 MCG/2ML IJ SOLN
INTRAMUSCULAR | Status: DC | PRN
  Administered 2023-09-04: 50 ug via INTRAVENOUS

## 2023-09-04 MED ORDER — EPHEDRINE SULFATE-NACL 50-0.9 MG/10ML-% IV SOSY
PREFILLED_SYRINGE | INTRAVENOUS | Status: DC | PRN
Start: 2023-09-04 — End: 2023-09-04
  Administered 2023-09-04 (×2): 10 mg via INTRAVENOUS

## 2023-09-04 MED ORDER — OXYCODONE HCL 5 MG PO TABS: 5.0000 mg | ORAL_TABLET | Freq: Once | ORAL | Status: DC | PRN

## 2023-09-04 MED ORDER — MIDAZOLAM HCL 2 MG/2ML IJ SOLN
INTRAMUSCULAR | Status: DC | PRN
  Administered 2023-09-04: 1 mg via INTRAVENOUS

## 2023-09-04 MED ORDER — MIDAZOLAM HCL 2 MG/2ML IJ SOLN
INTRAMUSCULAR | Status: AC
  Filled 2023-09-04: qty 2

## 2023-09-04 MED ORDER — EPHEDRINE 5 MG/ML INJ
INTRAVENOUS | Status: AC
  Filled 2023-09-04: qty 5

## 2023-09-04 MED ORDER — DEXAMETHASONE SODIUM PHOSPHATE 10 MG/ML IJ SOLN
INTRAMUSCULAR | Status: DC | PRN
  Administered 2023-09-04: 5 mg via INTRAVENOUS

## 2023-09-04 MED ORDER — ACETAMINOPHEN 500 MG PO TABS
ORAL_TABLET | ORAL | Status: AC
  Filled 2023-09-04: qty 2

## 2023-09-04 MED ORDER — DEXAMETHASONE SODIUM PHOSPHATE 10 MG/ML IJ SOLN
INTRAMUSCULAR | Status: AC
  Filled 2023-09-04: qty 1

## 2023-09-04 MED ORDER — ONDANSETRON HCL 4 MG/2ML IJ SOLN
INTRAMUSCULAR | Status: AC
  Filled 2023-09-04: qty 2

## 2023-09-04 MED ORDER — 0.9 % SODIUM CHLORIDE (POUR BTL) OPTIME
TOPICAL | Status: DC | PRN
  Administered 2023-09-04: 250 mL

## 2023-09-04 MED ORDER — FENTANYL CITRATE (PF) 100 MCG/2ML IJ SOLN
100.0000 ug | Freq: Once | INTRAMUSCULAR | Status: AC
  Administered 2023-09-04: 50 ug via INTRAVENOUS

## 2023-09-04 MED ORDER — ACETAMINOPHEN 500 MG PO TABS
1000.0000 mg | ORAL_TABLET | Freq: Once | ORAL | Status: AC
  Administered 2023-09-04: 1000 mg via ORAL

## 2023-09-04 MED ORDER — FENTANYL CITRATE (PF) 100 MCG/2ML IJ SOLN: 25.0000 ug | INTRAMUSCULAR | Status: DC | PRN

## 2023-09-04 MED ORDER — BUPIVACAINE-EPINEPHRINE (PF) 0.5% -1:200000 IJ SOLN
INTRAMUSCULAR | Status: DC | PRN
  Administered 2023-09-04: 20 mL via PERINEURAL

## 2023-09-04 MED ORDER — OXYCODONE HCL 5 MG/5ML PO SOLN: 5.0000 mg | Freq: Once | ORAL | Status: DC | PRN

## 2023-09-04 MED ORDER — LIDOCAINE 2% (20 MG/ML) 5 ML SYRINGE
INTRAMUSCULAR | Status: DC | PRN
  Administered 2023-09-04: 40 mg via INTRAVENOUS

## 2023-09-04 MED ORDER — VANCOMYCIN HCL 500 MG IV SOLR
INTRAVENOUS | Status: AC
  Filled 2023-09-04: qty 10

## 2023-09-04 MED ORDER — FENTANYL CITRATE (PF) 100 MCG/2ML IJ SOLN
INTRAMUSCULAR | Status: AC
  Filled 2023-09-04: qty 2

## 2023-09-04 MED ORDER — PROPOFOL 10 MG/ML IV BOLUS
INTRAVENOUS | Status: DC | PRN
  Administered 2023-09-04: 130 mg via INTRAVENOUS

## 2023-09-04 MED ORDER — DROPERIDOL 2.5 MG/ML IJ SOLN: 0.6250 mg | Freq: Once | INTRAMUSCULAR | Status: DC | PRN

## 2023-09-04 MED ORDER — CEFAZOLIN SODIUM-DEXTROSE 2-4 GM/100ML-% IV SOLN
2.0000 g | INTRAVENOUS | Status: AC
  Administered 2023-09-04: 2 g via INTRAVENOUS

## 2023-09-04 MED ORDER — FENTANYL CITRATE (PF) 100 MCG/2ML IJ SOLN
INTRAMUSCULAR | Status: AC
Start: 2023-09-04 — End: ?
  Filled 2023-09-04: qty 2

## 2023-09-04 MED ORDER — CHLORHEXIDINE GLUCONATE 4 % EX SOLN: 60.0000 mL | Freq: Once | CUTANEOUS | Status: DC

## 2023-09-04 MED ORDER — LIDOCAINE 2% (20 MG/ML) 5 ML SYRINGE
INTRAMUSCULAR | Status: AC
  Filled 2023-09-04: qty 5

## 2023-09-04 MED ORDER — LACTATED RINGERS IV SOLN: INTRAVENOUS | Status: DC

## 2023-09-04 SURGICAL SUPPLY — 56 items
BANDAGE ESMARK 6X9 LF (GAUZE/BANDAGES/DRESSINGS) IMPLANT
BIT DRILL 2.9 CANN QC NONSTRL (BIT) IMPLANT
BLADE SURG 15 STRL LF DISP TIS (BLADE) ×4 IMPLANT
BNDG COHESIVE 4X5 TAN STRL LF (GAUZE/BANDAGES/DRESSINGS) IMPLANT
BNDG ELASTIC 6X10 VLCR STRL LF (GAUZE/BANDAGES/DRESSINGS) ×1 IMPLANT
BNDG GAUZE DERMACEA FLUFF 4 (GAUZE/BANDAGES/DRESSINGS) ×1 IMPLANT
CANISTER SUCT 1200ML W/VALVE (MISCELLANEOUS) IMPLANT
CHLORAPREP W/TINT 26 (MISCELLANEOUS) ×1 IMPLANT
COVER BACK TABLE 60X90IN (DRAPES) ×1 IMPLANT
CUFF TRNQT CYL 34X4.125X (TOURNIQUET CUFF) ×1 IMPLANT
DRAPE C-ARM 42X72 X-RAY (DRAPES) ×1 IMPLANT
DRAPE C-ARMOR (DRAPES) ×1 IMPLANT
DRAPE EXTREMITY T 121X128X90 (DISPOSABLE) ×1 IMPLANT
DRAPE IMP U-DRAPE 54X76 (DRAPES) ×1 IMPLANT
DRAPE U-SHAPE 47X51 STRL (DRAPES) ×1 IMPLANT
DRSG MEPITEL 4X7.2 (GAUZE/BANDAGES/DRESSINGS) ×1 IMPLANT
ELECTRODE REM PT RTRN 9FT ADLT (ELECTROSURGICAL) ×1 IMPLANT
GAUZE PAD ABD 8X10 STRL (GAUZE/BANDAGES/DRESSINGS) ×1 IMPLANT
GAUZE SPONGE 4X4 12PLY STRL (GAUZE/BANDAGES/DRESSINGS) ×1 IMPLANT
GLOVE BIOGEL PI IND STRL 8 (GLOVE) ×1 IMPLANT
GLOVE SURG SS PI 7.5 STRL IVOR (GLOVE) ×2 IMPLANT
GOWN STRL REUS W/ TWL LRG LVL3 (GOWN DISPOSABLE) ×2 IMPLANT
KWIRE ACE 1.6X6 (WIRE) IMPLANT
MARKER SKIN DUAL TIP RULER LAB (MISCELLANEOUS) IMPLANT
NDL HYPO 25X1 1.5 SAFETY (NEEDLE) IMPLANT
NEEDLE HYPO 25X1 1.5 SAFETY (NEEDLE) IMPLANT
NS IRRIG 1000ML POUR BTL (IV SOLUTION) ×1 IMPLANT
PACK BASIN DAY SURGERY FS (CUSTOM PROCEDURE TRAY) ×1 IMPLANT
PAD CAST 4YDX4 CTTN HI CHSV (CAST SUPPLIES) ×1 IMPLANT
PADDING CAST ABS COTTON 4X4 ST (CAST SUPPLIES) IMPLANT
PADDING CAST SYNTHETIC 4X4 STR (CAST SUPPLIES) ×3 IMPLANT
PADDING CAST SYNTHETIC 6X4 NS (CAST SUPPLIES) ×2 IMPLANT
PENCIL SMOKE EVACUATOR (MISCELLANEOUS) ×1 IMPLANT
SCREW NLOCK CANC HEX 4X32 (Screw) IMPLANT
SCREW NLOCK CANC HEX 4X40 (Screw) IMPLANT
SHEET MEDIUM DRAPE 40X70 STRL (DRAPES) ×1 IMPLANT
SLEEVE SCD COMPRESS KNEE MED (STOCKING) ×1 IMPLANT
SPIKE FLUID TRANSFER (MISCELLANEOUS) IMPLANT
SPLINT FIBERGLASS 4X30 (CAST SUPPLIES) ×2 IMPLANT
SPONGE T-LAP 18X18 ~~LOC~~+RFID (SPONGE) ×1 IMPLANT
STAPLER SKIN PROX WIDE 3.9 (STAPLE) ×1 IMPLANT
STOCKINETTE 6 STRL (DRAPES) ×1 IMPLANT
STOCKINETTE ORTHO 6X25 (MISCELLANEOUS) ×1 IMPLANT
SUCTION TUBE FRAZIER 10FR DISP (SUCTIONS) IMPLANT
SUT ETHILON 2 0 FS 18 (SUTURE) ×2 IMPLANT
SUT MNCRL AB 3-0 PS2 18 (SUTURE) IMPLANT
SUT PDS 3-0 CT2 (SUTURE) IMPLANT
SUT PDS II 3-0 CT2 27 ABS (SUTURE) IMPLANT
SUT VIC AB 0 CT1 27XBRD ANBCTR (SUTURE) IMPLANT
SUT VIC AB 2-0 CT1 TAPERPNT 27 (SUTURE) ×1 IMPLANT
SUT VIC AB 3-0 SH 27X BRD (SUTURE) IMPLANT
SYR BULB IRRIG 60ML STRL (SYRINGE) ×1 IMPLANT
SYR CONTROL 10ML LL (SYRINGE) IMPLANT
TOWEL GREEN STERILE FF (TOWEL DISPOSABLE) ×2 IMPLANT
TUBE CONNECTING 20X1/4 (TUBING) IMPLANT
UNDERPAD 30X36 HEAVY ABSORB (UNDERPADS AND DIAPERS) ×1 IMPLANT

## 2023-09-04 NOTE — H&P (Signed)
 H&P Update:  -History and Physical Reviewed  -Patient has been re-examined  -No change in the plan of care  -The risks and benefits were presented and reviewed. The risks due to eventual need for elective removal of hardware, hardware/suture failure and/or irritation, new/persistent infection, stiffness, nerve/vessel/tendon injury or rerupture of repaired tendon, nonunion/malunion, allograft usage, wound healing issues, development of arthritis, failure of this surgery, possibility of external fixation with delayed definitive surgery, need for further surgery, thromboembolic events, anesthesia/medical complications, amputation, death among others were discussed. The patient acknowledged the explanation, agreed to proceed with the plan and a consent was signed.  Ali Ink

## 2023-09-04 NOTE — Anesthesia Postprocedure Evaluation (Signed)
 Anesthesia Post Note  Patient: [name]  Procedure(s) Performed: OPEN REDUCTION INTERNAL FIXATION (ORIF) FOOT LISFRANC FRACTURE (Right: Foot)     Patient location during evaluation: PACU Anesthesia Type: General Level of consciousness: awake and alert and oriented Pain management: pain level controlled Vital Signs Assessment: post-procedure vital signs reviewed and stable Respiratory status: spontaneous breathing, nonlabored ventilation and respiratory function stable Cardiovascular status: blood pressure returned to baseline and stable Postop Assessment: no apparent nausea or vomiting Anesthetic complications: no   No notable events documented.  Last Vitals:  Vitals:   09/04/23 1415 09/04/23 1430  BP: (!) 103/56 (!) 108/55  Pulse: 79 75  Resp: 13 15  Temp:    SpO2: 93% 92%    Last Pain:  Vitals:   09/04/23 1430  TempSrc:   PainSc: 0-No pain        RLE Motor Response: Purposeful movement;Responds to commands (09/04/23 1430) RLE Sensation: Numbness;Decreased (09/04/23 1430)      Crystal Cutbirth A.

## 2023-09-04 NOTE — Anesthesia Preprocedure Evaluation (Addendum)
 Anesthesia Evaluation  Patient identified by MRN, date of birth, ID band Patient awake    Reviewed: Allergy & Precautions, H&P , NPO status , Patient's Chart, lab work & pertinent test results  Airway Mallampati: II  TM Distance: >3 FB Neck ROM: Full    Dental  (+) Missing, Dental Advisory Given   Pulmonary pneumonia, former smoker   Pulmonary exam normal breath sounds clear to auscultation       Cardiovascular Exercise Tolerance: Good hypertension, Pt. on medications Normal cardiovascular exam Rhythm:Regular Rate:Normal     Neuro/Psych negative neurological ROS  negative psych ROS   GI/Hepatic Neg liver ROS,GERD  ,,  Endo/Other  negative endocrine ROS    Renal/GU negative Renal ROS     Musculoskeletal  (+) Arthritis ,    Abdominal  (+) + obese  Peds  Hematology negative hematology ROS (+)   Anesthesia Other Findings   Reproductive/Obstetrics                             Anesthesia Physical Anesthesia Plan  ASA: 2  Anesthesia Plan: General   Post-op Pain Management: Tylenol  PO (pre-op)* and Regional block*   Induction:   PONV Risk Score and Plan: 2 and Treatment may vary due to age or medical condition, Ondansetron  and Dexamethasone   Airway Management Planned: LMA  Additional Equipment:   Intra-op Plan:   Post-operative Plan: Extubation in OR  Informed Consent: I have reviewed the patients History and Physical, chart, labs and discussed the procedure including the risks, benefits and alternatives for the proposed anesthesia with the patient or authorized representative who has indicated his/her understanding and acceptance.     Dental advisory given  Plan Discussed with: CRNA  Anesthesia Plan Comments: (Dr. Cherl Corner specifically requested block for saphenous distribution ONLY. )       Anesthesia Quick Evaluation

## 2023-09-04 NOTE — Op Note (Signed)
 09/04/2023  2:43 PM   PATIENT: [name]  68 y.o. female  MRN: [id number]   PRE-OPERATIVE DIAGNOSIS:   Closed nondisplaced fracture of second and third metatarsal bones of right foot, initial encounter   POST-OPERATIVE DIAGNOSIS:   Same   PROCEDURE: 1] Right foot open reduction internal fixation of Lisfranc joint complex 2] Right foot open reduction internal fixation of intercuneiform joint (medial-intermediate cuneiform) 3] Right foot closed reduction with manipulation and splinting 3rd metatarsal  4] Right foot closed reduction with manipulation and splinting 4th metatarsal    SURGEON:  Ali Ink, MD   ASSISTANT: None   ANESTHESIA: General, regional   EBL: Minimal   TOURNIQUET:    Total Tourniquet Time Documented: Thigh (Right) - 16 minutes Total: Thigh (Right) - 16 minutes    COMPLICATIONS: None apparent   DISPOSITION: Extubated, awake and stable to recovery.   INDICATION FOR PROCEDURE: The patient presented with above diagnosis.  We discussed the diagnosis, alternative treatment options, risks and benefits of the above surgical intervention, as well as alternative non-operative treatments. All questions/concerns were addressed and the patient/family demonstrated appropriate understanding of the diagnosis, the procedure, the postoperative course, and overall prognosis. The patient wished to proceed with surgical intervention and signed an informed surgical consent as such, in each others presence prior to surgery.   PROCEDURE IN DETAIL: After preoperative consent was obtained and the correct operative site was identified, the patient was brought to the operating room supine on stretcher and transferred onto operating table. General anesthesia was induced. Preoperative antibiotics were administered. Surgical timeout was taken. The patient was then positioned supine with an ipsilateral hip bump. The operative lower extremity was prepped and draped  in standard sterile fashion with a tourniquet around the thigh. The extremity was exsanguinated and the tourniquet was inflated to 275 mmHg.  Abduction stress testing of the foot demonstrated instability at the Lisfranc complex. A dorsal incision was made to debride the area of the Lisfranc ligament and to place pointed reduction clamp. The midfoot was reduced and this was provisionally pinned with Kirschner wire in the orientation of the native Lisfranc ligament. The intercuneiform joint between the medial and intermedial cuneiforms was also pinned with Kirschner wire.    Medial incisions were made over these wires and dissection carried down to the level of bone. The wires were overdrilled sequentially with a cannulated drill and the wires were removed while maintaining midfoot reduction. Two solid 4.0 fully threaded headed screws were implanted and stability was noted to clinical and fluoroscopic testing.   The 3rd and 4th metatarsal bones were closed reduced with manipulation under intraoperative fluoroscopy.  The surgical sites were thoroughly irrigated. The tourniquet was deflated and hemostasis achieved. The skin was closed without tension.    The leg was cleaned with saline and sterile dressings with gauze were applied. A well padded bulky wrap and boot were applied. The patient was awakened from anesthesia and transported to the recovery room in stable condition.    FOLLOW UP PLAN: -transfer to PACU, then home -strict TDWB in CAM boot operative extremity, maximum elevation. Boot chosen over splint/cast due to high fall risk -maintain dressings until follow up -DVT ppx: Aspirin  81 mg twice daily while NWB -follow up as outpatient within 7-10 days for wound check -sutures out in 2-3 weeks in outpatient office   RADIOGRAPHS: AP, lateral, oblique and stress radiographs of the right foot were obtained intraoperatively. These showed interval reduction and fixation of the fractures. Manual  stress radiographs were taken and the joints were noted to be stable following fixation. All hardware is appropriately positioned and of the appropriate lengths. No other acute injuries are noted.   Ali Ink Orthopaedic Surgery [facility]

## 2023-09-04 NOTE — Anesthesia Procedure Notes (Signed)
 Anesthesia Regional Block: Adductor canal block   Pre-Anesthetic Checklist: , timeout performed,  Correct Patient, Correct Site, Correct Laterality,  Correct Procedure, Correct Position, site marked,  Risks and benefits discussed,  Surgical consent,  Pre-op evaluation,  At surgeon's request and post-op pain management  Laterality: Lower and Right  Prep: chloraprep       Needles:  Injection technique: Single-shot  Needle Type: Stimiplex     Needle Length: 9cm  Needle Gauge: 21     Additional Needles:   Procedures:,,,, ultrasound used (permanent image in chart),,    Narrative:  Start time: 09/04/2023 12:08 PM End time: 09/04/2023 12:28 PM Injection made incrementally with aspirations every 5 mL.  Performed by: Personally  Anesthesiologist: Gorman Laughter, MD  Additional Notes: BP cuff, EKG monitors applied. Sedation begun. Artery and nerve location verified with ultrasound. Anesthetic injected incrementally (5ml), slowly, and after negative aspirations under direct u/s guidance. Good fascial/perineural spread. Tolerated well.

## 2023-09-04 NOTE — Progress Notes (Signed)
Assisted Dr. Germeroth with right, adductor canal, ultrasound guided block. Side rails up, monitors on throughout procedure. See vital signs in flow sheet. Tolerated Procedure well. ? ?

## 2023-09-04 NOTE — Anesthesia Procedure Notes (Signed)
 Procedure Name: LMA Insertion Date/Time: 09/04/2023 1:33 PM  Performed by: Glo Larch, CRNAPre-anesthesia Checklist: Patient identified, Emergency Drugs available, Suction available and Patient being monitored Patient Re-evaluated:Patient Re-evaluated prior to induction Oxygen Delivery Method: Circle system utilized Preoxygenation: Pre-oxygenation with 100% oxygen Induction Type: IV induction Ventilation: Mask ventilation without difficulty LMA: LMA inserted LMA Size: 4.0 Number of attempts: 1 Airway Equipment and Method: Bite block Placement Confirmation: positive ETCO2 Tube secured with: Tape Dental Injury: Teeth and Oropharynx as per pre-operative assessment

## 2023-09-04 NOTE — Transfer of Care (Signed)
 Immediate Anesthesia Transfer of Care Note  Patient: [name]  Procedure(s) Performed: Procedure(s) (LRB): OPEN REDUCTION INTERNAL FIXATION (ORIF) FOOT LISFRANC FRACTURE (Right)  Patient Location: PACU  Anesthesia Type: General  Level of Consciousness: awake, oriented, sedated and patient cooperative  Airway & Oxygen Therapy: Patient Spontanous Breathing Room Air  Post-op Assessment: Report given to PACU RN and Post -op Vital signs reviewed and stable  Post vital signs: Reviewed and stable  Complications: No apparent anesthesia complications  Last Vitals:  Vitals Value Taken Time  BP    Temp    Pulse 79 09/04/23 1411  Resp 12 09/04/23 1411  SpO2 94 % 09/04/23 1411  Vitals shown include unfiled device data.  Last Pain:  Vitals:   09/04/23 1154  TempSrc: Temporal  PainSc: 0-No pain      Patients Stated Pain Goal: 3 (09/04/23 1154)  Complications: No notable events documented.

## 2023-09-05 ENCOUNTER — Encounter (HOSPITAL_BASED_OUTPATIENT_CLINIC_OR_DEPARTMENT_OTHER): Payer: Self-pay | Admitting: Orthopaedic Surgery

## 2023-09-09 ENCOUNTER — Ambulatory Visit (HOSPITAL_BASED_OUTPATIENT_CLINIC_OR_DEPARTMENT_OTHER): Admitting: Internal Medicine

## 2023-09-10 ENCOUNTER — Other Ambulatory Visit: Payer: Self-pay

## 2023-09-10 ENCOUNTER — Encounter: Payer: Self-pay | Admitting: Infectious Diseases

## 2023-09-10 ENCOUNTER — Ambulatory Visit: Admitting: Infectious Diseases

## 2023-09-10 VITALS — BP 121/74 | HR 69 | Temp 97.6°F

## 2023-09-10 DIAGNOSIS — R7303 Prediabetes: Secondary | ICD-10-CM

## 2023-09-10 DIAGNOSIS — S81802A Unspecified open wound, left lower leg, initial encounter: Secondary | ICD-10-CM | POA: Insufficient documentation

## 2023-09-10 DIAGNOSIS — Z79899 Other long term (current) drug therapy: Secondary | ICD-10-CM | POA: Insufficient documentation

## 2023-09-10 NOTE — Progress Notes (Signed)
 Patient Active Problem List   Diagnosis Date Noted   S/P total knee arthroplasty, left 06/28/2022   Medication monitoring encounter 01/19/2022   Pyogenic inflammation of bone (HCC) 01/19/2022   Prediabetes 01/19/2022   Neuropathy 01/19/2022   Change in bowel habits 07/09/2018   History of colonic polyps 07/09/2018    Patient's Medications  New Prescriptions   No medications on file  Previous Medications   AMLODIPINE  (NORVASC ) 5 MG TABLET    Take 5 mg by mouth at bedtime.   ATORVASTATIN  (LIPITOR) 20 MG TABLET    Take 20 mg by mouth daily.   B COMPLEX-C-FOLIC ACID  (SUPER B COMPLEX/FA/VIT C PO)    Take by mouth.   CALCIUM  CITRATE-VITAMIN D (CITRACAL PETITES/VITAMIN D PO)    Take 2 tablets by mouth 2 (two) times daily.    CELECOXIB (CELEBREX) 200 MG CAPSULE    Take 400 mg by mouth daily.   CHOLECALCIFEROL (VITAMIN D3) 250 MCG (10000 UT) CAPSULE    Take 10,000 Units by mouth in the morning.   DOCUSATE SODIUM  (COLACE) 100 MG CAPSULE    Take 100 mg by mouth daily as needed for mild constipation.   GABAPENTIN  (NEURONTIN ) 300 MG CAPSULE    Take 600 mg by mouth at bedtime.   MULTIPLE VITAMIN (MULTIVITAMIN) TABLET    Take 1 tablet by mouth daily. Centrum silver Woman   OLMESARTAN -HYDROCHLOROTHIAZIDE  (BENICAR  HCT) 40-25 MG TABLET    Take 1 tablet by mouth in the morning.   OMEGA-3 FATTY ACIDS (FISH OIL) 1000 MG CAPS    Take 1,000 mg by mouth daily.   PROBIOTIC PRODUCT (PROBIOTIC PO)    Take 1 capsule by mouth in the morning.  Modified Medications   No medications on file  Discontinued Medications   No medications on file    Subjective: Discussed the use of AI scribe software for clinical note transcription with the patient, who gave verbal consent to proceed.   68 year old female with prior h/o HTN, HLD, GERD, Prediabetes, Neuropathy, GERD, Arthritis, Left TKA, left 2nd toe osteomyelitis s/p  partial amputation ( healed) who is referred from Orthopedics Dr Cherl Corner for  non-healing  wound of left leg. She is accompanied by her sister-in-law.  She reports non-healing wound on her left leg, initially injured in early 2024, possibly from banging it on a drawer. Despite multiple antibiotic courses,  including a incision and debridement in June 05 2023 where a hematoma was noted, no cx sent,  the wound has not fully healed. She has been referred to wound care center and has been closely following with them and the wound has shown some improvement, becoming smaller in size.  She completed courses of antibiotics, including Keflex and doxycycline , since Jan 2025. Stopped antibiotics since around mid-April 2025, coinciding with her first wound care treatment. The wound still has a little drainage but is not significant. No signs of infection such as fever, chills, nausea, or vomiting, redness, tenderness or purulence at the wound are present. She reports wound care measures size regularly, and it has decreased in size.  Her left leg was swollen but has since reduced significantly. She does not smoke or consume alcohol, having quit smoking in 2008 and never being a regular drinker. Her right foot had a fracture and had a  surgery recently on 5/14, with no wounds or infective concerns.  Review of Systems: all systems reviewed with pertinent positives and negatives as listed above including MSK.   Past Medical  History:  Diagnosis Date   Arthritis    lt knee   GERD (gastroesophageal reflux disease)    Hypercholesteremia    Hypertension    Neuropathy    Osteomyelitis (HCC)    left 2nd toe   Pneumonia    as a child   Pre-diabetes    Ulcer of left lower extremity (HCC)    Past Surgical History:  Procedure Laterality Date   ABDOMINAL HYSTERECTOMY     AMPUTATION TOE Left 01/11/2022   Procedure: Left 2nd toe partial amputation;  Surgeon: Ali Ink, MD;  Location: [facility];  Service: Orthopedics;  Laterality: Left;  60 min   BIOPSY  03/13/2018    Procedure: BIOPSY;  Surgeon: Suzette Espy, MD;  [facility];  Service: Endoscopy;;  (colon)   BLADDER SUSPENSION     COLONOSCOPY WITH PROPOFOL  N/A 03/13/2018   Procedure: COLONOSCOPY WITH PROPOFOL ;  Surgeon: Suzette Espy, MD;  [facility]: [facility];  Service: Endoscopy;  Laterality: N/A;  1:00pm-rescheduled to 11/21 @ 1:30pm per Mindy   COLONOSCOPY WITH PROPOFOL  N/A 07/05/2023   Procedure: COLONOSCOPY WITH PROPOFOL ;  Surgeon: Suzette Espy, MD;  [facility];  Service: Endoscopy;  Laterality: N/A;  11:15 am, asa 2   INCISION AND DRAINAGE OF WOUND Left 06/05/2023   Procedure: LEFT LEG IRRIGATION AND DEBRIDEMENT WOUND;  Surgeon: Ali Ink, MD;  Location: [facility];  Service: Orthopedics;  Laterality: Left;   OPEN REDUCTION INTERNAL FIXATION (ORIF) FOOT LISFRANC FRACTURE Right 09/04/2023   Procedure: OPEN REDUCTION INTERNAL FIXATION (ORIF) FOOT LISFRANC FRACTURE;  Surgeon: Ali Ink, MD;  Location: [facility];  Service: Orthopedics;  Laterality: Right;   POLYPECTOMY  07/05/2023   Procedure: POLYPECTOMY;  Surgeon: Suzette Espy, MD;  [facility];  Service: Endoscopy;;   TOTAL KNEE ARTHROPLASTY Left 06/28/2022   Procedure: TOTAL KNEE ARTHROPLASTY;  Surgeon: Claiborne Crew, MD;  Location: [facility];  Service: Orthopedics;  Laterality: Left;     Social History   Tobacco Use   Smoking status: Former   Smokeless tobacco: Never  Vaping Use   Vaping status: Never Used  Substance Use Topics   Alcohol use: Never   Drug use: Never    No family history on file.  Allergies  Allergen Reactions   Wound Dressing Adhesive Rash    Health Maintenance  Topic Date Due   Hepatitis C Screening  Never done   DTaP/Tdap/Td (1 - Tdap) Never done   Pneumonia Vaccine 53+ Years old (1 of 2 - PCV) Never done   Zoster Vaccines- Shingrix (1 of 2) Never done   COVID-19 Vaccine (1 - 2024-25 season) Never done   INFLUENZA  VACCINE  11/22/2023   Medicare Annual Wellness (AWV)  03/03/2024   MAMMOGRAM  05/29/2025   Colonoscopy  07/04/2033   DEXA SCAN  Completed   HPV VACCINES  Aged Out   Meningococcal B Vaccine  Aged Out    Objective: BP 121/74   Pulse 69   Temp 97.6 F (36.4 C) (Oral)   SpO2 91%    Physical Exam Constitutional:      Appearance: Normal appearance.  HENT:     Head: Normocephalic and atraumatic.      Mouth: Mucous membranes are moist.  Eyes:    Conjunctiva/sclera: Conjunctivae normal.     Pupils: Pupils are equal, round, and b/l symmetrical    Cardiovascular:     Rate and Rhythm: Normal rate and  regular rhythm.     Heart sounds: s1s2  Pulmonary:     Effort: Pulmonary effort is normal.     Breath sounds: Normal breath sounds.   Abdominal:     General: Non distended     Palpations: soft. Non tender   Musculoskeletal:        General: Normal range of motion.  Healing wound in the anterior aspect of left lower leg with no active drainage, no cellulitis, no signs of active infection   Left TKA with no concerns. No signs of infection    Rt foot bandaged after recent surgery for #. No concerns per patient and deferred exam Skin:    General: Skin is warm and dry.     Comments:  Neurological:     General: grossly non focal     Mental Status: awake, alert and oriented to person, place, and time.   Psychiatric:        Mood and Affect: Mood normal.   Lab Results Lab Results  Component Value Date   WBC 11.1 (H) 06/29/2022   HGB 11.5 (L) 06/29/2022   HCT 35.9 (L) 06/29/2022   MCV 95.2 06/29/2022   PLT 200 06/29/2022    Lab Results  Component Value Date   CREATININE 1.04 (H) 09/02/2023   BUN 19 09/02/2023   NA 140 09/02/2023   K 4.0 09/02/2023   CL 102 09/02/2023   CO2 27 09/02/2023    Lab Results  Component Value Date   ALT 26 01/18/2022   AST 27 01/18/2022   ALKPHOS 53 03/11/2018   BILITOT 0.4 01/18/2022    No results found for: "CHOL", "HDL", "LDLCALC",  "LDLDIRECT", "TRIG", "CHOLHDL" No results found for: "LABRPR", "RPRTITER" No results found for: "HIV1RNAQUANT", "HIV1RNAVL", "CD4TABS"  08/09/23 ABI Summary:  Right: Resting right ankle-brachial index is within normal range. The  right toe-brachial index is abnormal.   Left: Resting left ankle-brachial index is within normal range. The left  toe-brachial index is normal.    Assessment/Plan # Open wound in the left leg, presumed venous wound - clinically improving and decreasing in size - discussed no indication of antibiotics currently but watch for signs of infection like redness, tenderness, purulent drainage, fevers - Fu with wound care  - Fu as needed   # Prediabetes  - BG control - Fu with PCP   I have personally spent 60 minutes involved in face-to-face and non-face-to-face activities for this patient on the day of the visit. Professional time spent includes the following activities: Preparing to see the patient (review of tests), Obtaining and reviewing separately obtained history (outside notes from Dr Cherl Corner, wound care notes), Performing a medically appropriate examination and evaluation , referring and communicating with other health care professionals, Documenting clinical information in the EMR, Independently interpreting results (not separately reported), Communicating results to the patient, Counseling and educating the patient and Care coordination (not separately reported).   Of note, portions of this note may have been created with voice recognition software. While this note has been edited for accuracy, occasional wrong-word or 'sound-a-like' substitutions may have occurred due to the inherent limitations of voice recognition software.   Melvina Stage, MD [facility] 09/10/2023, 1:51 PM

## 2023-09-11 ENCOUNTER — Encounter (HOSPITAL_BASED_OUTPATIENT_CLINIC_OR_DEPARTMENT_OTHER): Admitting: Internal Medicine

## 2023-09-11 DIAGNOSIS — I87332 Chronic venous hypertension (idiopathic) with ulcer and inflammation of left lower extremity: Secondary | ICD-10-CM | POA: Diagnosis not present

## 2023-09-11 DIAGNOSIS — I890 Lymphedema, not elsewhere classified: Secondary | ICD-10-CM | POA: Diagnosis not present

## 2023-09-11 DIAGNOSIS — L97828 Non-pressure chronic ulcer of other part of left lower leg with other specified severity: Secondary | ICD-10-CM

## 2023-09-17 ENCOUNTER — Ambulatory Visit (HOSPITAL_BASED_OUTPATIENT_CLINIC_OR_DEPARTMENT_OTHER): Admitting: Internal Medicine

## 2023-09-19 ENCOUNTER — Encounter (HOSPITAL_BASED_OUTPATIENT_CLINIC_OR_DEPARTMENT_OTHER): Admitting: Internal Medicine

## 2023-09-19 DIAGNOSIS — L97828 Non-pressure chronic ulcer of other part of left lower leg with other specified severity: Secondary | ICD-10-CM | POA: Diagnosis not present

## 2023-09-19 DIAGNOSIS — I87332 Chronic venous hypertension (idiopathic) with ulcer and inflammation of left lower extremity: Secondary | ICD-10-CM

## 2023-09-19 DIAGNOSIS — I890 Lymphedema, not elsewhere classified: Secondary | ICD-10-CM | POA: Diagnosis not present

## 2023-09-23 ENCOUNTER — Ambulatory Visit (HOSPITAL_BASED_OUTPATIENT_CLINIC_OR_DEPARTMENT_OTHER): Admitting: Internal Medicine

## 2023-09-26 ENCOUNTER — Encounter (HOSPITAL_BASED_OUTPATIENT_CLINIC_OR_DEPARTMENT_OTHER): Attending: Internal Medicine | Admitting: Internal Medicine

## 2023-09-26 DIAGNOSIS — L97828 Non-pressure chronic ulcer of other part of left lower leg with other specified severity: Secondary | ICD-10-CM | POA: Insufficient documentation

## 2023-09-26 DIAGNOSIS — I890 Lymphedema, not elsewhere classified: Secondary | ICD-10-CM | POA: Insufficient documentation

## 2023-09-26 DIAGNOSIS — I87332 Chronic venous hypertension (idiopathic) with ulcer and inflammation of left lower extremity: Secondary | ICD-10-CM | POA: Diagnosis not present

## 2023-09-26 DIAGNOSIS — L97822 Non-pressure chronic ulcer of other part of left lower leg with fat layer exposed: Secondary | ICD-10-CM | POA: Diagnosis not present

## 2023-10-04 ENCOUNTER — Encounter (HOSPITAL_BASED_OUTPATIENT_CLINIC_OR_DEPARTMENT_OTHER): Admitting: Internal Medicine

## 2023-10-04 DIAGNOSIS — I890 Lymphedema, not elsewhere classified: Secondary | ICD-10-CM | POA: Diagnosis not present

## 2023-10-04 DIAGNOSIS — I87332 Chronic venous hypertension (idiopathic) with ulcer and inflammation of left lower extremity: Secondary | ICD-10-CM

## 2023-10-04 DIAGNOSIS — L97828 Non-pressure chronic ulcer of other part of left lower leg with other specified severity: Secondary | ICD-10-CM | POA: Diagnosis not present

## 2023-10-10 ENCOUNTER — Encounter (HOSPITAL_BASED_OUTPATIENT_CLINIC_OR_DEPARTMENT_OTHER): Admitting: Internal Medicine

## 2023-10-10 DIAGNOSIS — L97828 Non-pressure chronic ulcer of other part of left lower leg with other specified severity: Secondary | ICD-10-CM

## 2023-10-10 DIAGNOSIS — I890 Lymphedema, not elsewhere classified: Secondary | ICD-10-CM

## 2023-10-10 DIAGNOSIS — I87332 Chronic venous hypertension (idiopathic) with ulcer and inflammation of left lower extremity: Secondary | ICD-10-CM

## 2023-10-17 ENCOUNTER — Encounter (HOSPITAL_BASED_OUTPATIENT_CLINIC_OR_DEPARTMENT_OTHER): Admitting: Internal Medicine

## 2023-10-17 DIAGNOSIS — I890 Lymphedema, not elsewhere classified: Secondary | ICD-10-CM | POA: Diagnosis not present

## 2023-10-17 DIAGNOSIS — L97828 Non-pressure chronic ulcer of other part of left lower leg with other specified severity: Secondary | ICD-10-CM

## 2023-10-17 DIAGNOSIS — I87332 Chronic venous hypertension (idiopathic) with ulcer and inflammation of left lower extremity: Secondary | ICD-10-CM

## 2023-10-22 ENCOUNTER — Encounter (HOSPITAL_BASED_OUTPATIENT_CLINIC_OR_DEPARTMENT_OTHER): Admitting: Internal Medicine

## 2023-11-04 DIAGNOSIS — S92324D Nondisplaced fracture of second metatarsal bone, right foot, subsequent encounter for fracture with routine healing: Secondary | ICD-10-CM | POA: Diagnosis not present

## 2023-11-06 DIAGNOSIS — M79671 Pain in right foot: Secondary | ICD-10-CM | POA: Diagnosis not present

## 2023-11-06 DIAGNOSIS — R2681 Unsteadiness on feet: Secondary | ICD-10-CM | POA: Diagnosis not present

## 2023-11-12 DIAGNOSIS — R2681 Unsteadiness on feet: Secondary | ICD-10-CM | POA: Diagnosis not present

## 2023-11-12 DIAGNOSIS — M79671 Pain in right foot: Secondary | ICD-10-CM | POA: Diagnosis not present

## 2023-11-21 DIAGNOSIS — R2681 Unsteadiness on feet: Secondary | ICD-10-CM | POA: Diagnosis not present

## 2023-11-21 DIAGNOSIS — M79671 Pain in right foot: Secondary | ICD-10-CM | POA: Diagnosis not present

## 2023-11-25 DIAGNOSIS — R2681 Unsteadiness on feet: Secondary | ICD-10-CM | POA: Diagnosis not present

## 2023-11-25 DIAGNOSIS — M79671 Pain in right foot: Secondary | ICD-10-CM | POA: Diagnosis not present

## 2023-11-28 DIAGNOSIS — R2681 Unsteadiness on feet: Secondary | ICD-10-CM | POA: Diagnosis not present

## 2023-11-28 DIAGNOSIS — M79671 Pain in right foot: Secondary | ICD-10-CM | POA: Diagnosis not present

## 2023-12-03 DIAGNOSIS — M79671 Pain in right foot: Secondary | ICD-10-CM | POA: Diagnosis not present

## 2023-12-03 DIAGNOSIS — R2681 Unsteadiness on feet: Secondary | ICD-10-CM | POA: Diagnosis not present

## 2023-12-06 DIAGNOSIS — M79671 Pain in right foot: Secondary | ICD-10-CM | POA: Diagnosis not present

## 2023-12-06 DIAGNOSIS — R2681 Unsteadiness on feet: Secondary | ICD-10-CM | POA: Diagnosis not present

## 2023-12-09 DIAGNOSIS — M79671 Pain in right foot: Secondary | ICD-10-CM | POA: Diagnosis not present

## 2023-12-09 DIAGNOSIS — R2681 Unsteadiness on feet: Secondary | ICD-10-CM | POA: Diagnosis not present

## 2023-12-11 DIAGNOSIS — R2681 Unsteadiness on feet: Secondary | ICD-10-CM | POA: Diagnosis not present

## 2023-12-11 DIAGNOSIS — M79671 Pain in right foot: Secondary | ICD-10-CM | POA: Diagnosis not present

## 2023-12-13 ENCOUNTER — Encounter: Payer: Self-pay | Admitting: Radiology

## 2023-12-13 DIAGNOSIS — R2681 Unsteadiness on feet: Secondary | ICD-10-CM | POA: Diagnosis not present

## 2023-12-13 DIAGNOSIS — M79671 Pain in right foot: Secondary | ICD-10-CM | POA: Diagnosis not present

## 2023-12-16 DIAGNOSIS — R7309 Other abnormal glucose: Secondary | ICD-10-CM | POA: Diagnosis not present

## 2023-12-16 DIAGNOSIS — E6609 Other obesity due to excess calories: Secondary | ICD-10-CM | POA: Diagnosis not present

## 2023-12-16 DIAGNOSIS — R2681 Unsteadiness on feet: Secondary | ICD-10-CM | POA: Diagnosis not present

## 2023-12-16 DIAGNOSIS — E785 Hyperlipidemia, unspecified: Secondary | ICD-10-CM | POA: Diagnosis not present

## 2023-12-16 DIAGNOSIS — I10 Essential (primary) hypertension: Secondary | ICD-10-CM | POA: Diagnosis not present

## 2023-12-16 DIAGNOSIS — Z6833 Body mass index (BMI) 33.0-33.9, adult: Secondary | ICD-10-CM | POA: Diagnosis not present

## 2023-12-16 DIAGNOSIS — M79671 Pain in right foot: Secondary | ICD-10-CM | POA: Diagnosis not present

## 2023-12-18 DIAGNOSIS — R2681 Unsteadiness on feet: Secondary | ICD-10-CM | POA: Diagnosis not present

## 2023-12-18 DIAGNOSIS — I10 Essential (primary) hypertension: Secondary | ICD-10-CM | POA: Diagnosis not present

## 2023-12-18 DIAGNOSIS — M79671 Pain in right foot: Secondary | ICD-10-CM | POA: Diagnosis not present

## 2023-12-18 DIAGNOSIS — E785 Hyperlipidemia, unspecified: Secondary | ICD-10-CM | POA: Diagnosis not present

## 2023-12-19 ENCOUNTER — Other Ambulatory Visit (HOSPITAL_COMMUNITY): Payer: Self-pay | Admitting: Family Medicine

## 2023-12-19 DIAGNOSIS — M67912 Unspecified disorder of synovium and tendon, left shoulder: Secondary | ICD-10-CM

## 2023-12-24 ENCOUNTER — Ambulatory Visit (HOSPITAL_COMMUNITY)
Admission: RE | Admit: 2023-12-24 | Discharge: 2023-12-24 | Disposition: A | Discharge status: Home / Self Care | Source: Ambulatory Visit | Attending: Family Medicine | Admitting: Family Medicine

## 2023-12-24 DIAGNOSIS — M67912 Unspecified disorder of synovium and tendon, left shoulder: Secondary | ICD-10-CM | POA: Diagnosis not present

## 2023-12-24 DIAGNOSIS — Z780 Asymptomatic menopausal state: Secondary | ICD-10-CM | POA: Insufficient documentation

## 2023-12-24 DIAGNOSIS — Z13820 Encounter for screening for osteoporosis: Secondary | ICD-10-CM | POA: Diagnosis not present

## 2023-12-30 DIAGNOSIS — S92324A Nondisplaced fracture of second metatarsal bone, right foot, initial encounter for closed fracture: Secondary | ICD-10-CM | POA: Diagnosis not present

## 2024-01-15 ENCOUNTER — Encounter (HOSPITAL_BASED_OUTPATIENT_CLINIC_OR_DEPARTMENT_OTHER): Admission: RE | Payer: Self-pay | Source: Home / Self Care

## 2024-01-15 ENCOUNTER — Ambulatory Visit (HOSPITAL_BASED_OUTPATIENT_CLINIC_OR_DEPARTMENT_OTHER): Admission: RE | Admit: 2024-01-15 | Source: Home / Self Care | Admitting: Orthopaedic Surgery

## 2024-01-15 SURGERY — REMOVAL, HARDWARE
Anesthesia: General | Laterality: Right

## 2024-01-22 ENCOUNTER — Encounter (HOSPITAL_BASED_OUTPATIENT_CLINIC_OR_DEPARTMENT_OTHER): Payer: Self-pay | Admitting: Orthopaedic Surgery

## 2024-01-22 ENCOUNTER — Other Ambulatory Visit: Payer: Self-pay

## 2024-01-23 NOTE — H&P (Signed)
 ORTHOPAEDIC SURGERY H&P  Subjective:  The patient presents for right foot removal of deep hardware.   Past Medical History:  Diagnosis Date   Arthritis    lt knee   GERD (gastroesophageal reflux disease)    Hypercholesteremia    Hypertension    Neuropathy    Osteomyelitis (HCC)    left 2nd toe   Pneumonia    as a child   Pre-diabetes    Ulcer of left lower extremity (HCC)     Past Surgical History:  Procedure Laterality Date   ABDOMINAL HYSTERECTOMY     AMPUTATION TOE Left 01/11/2022   Procedure: Left 2nd toe partial amputation;  Surgeon: Barton Drape, MD;  Location: [facility];  Service: Orthopedics;  Laterality: Left;  60 min   BIOPSY  03/13/2018   Procedure: BIOPSY;  Surgeon: Shaaron Lamar HERO, MD;  [facility];  Service: Endoscopy;;  (colon)   BLADDER SUSPENSION     COLONOSCOPY WITH PROPOFOL  N/A 03/13/2018   Procedure: COLONOSCOPY WITH PROPOFOL ;  Surgeon: Shaaron Lamar HERO, MD;  [facility];  Service: Endoscopy;  Laterality: N/A;  1:00pm-rescheduled to 11/21 @ 1:30pm per Mindy   COLONOSCOPY WITH PROPOFOL  N/A 07/05/2023   Procedure: COLONOSCOPY WITH PROPOFOL ;  Surgeon: Shaaron Lamar HERO, MD;  [facility];  Service: Endoscopy;  Laterality: N/A;  11:15 am, asa 2   INCISION AND DRAINAGE OF WOUND Left 06/05/2023   Procedure: LEFT LEG IRRIGATION AND DEBRIDEMENT WOUND;  Surgeon: Barton Drape, MD;  Location: [facility];  Service: Orthopedics;  Laterality: Left;   OPEN REDUCTION INTERNAL FIXATION (ORIF) FOOT LISFRANC FRACTURE Right 09/04/2023   Procedure: OPEN REDUCTION INTERNAL FIXATION (ORIF) FOOT LISFRANC FRACTURE;  Surgeon: Barton Drape, MD;  Location: [facility];  Service: Orthopedics;  Laterality: Right;   POLYPECTOMY  07/05/2023   Procedure: POLYPECTOMY;  Surgeon: Shaaron Lamar HERO, MD;  [facility];  Service: Endoscopy;;   TOTAL KNEE ARTHROPLASTY Left 06/28/2022   Procedure:  TOTAL KNEE ARTHROPLASTY;  Surgeon: Ernie Cough, MD;  Location: [facility];  Service: Orthopedics;  Laterality: Left;     (Not in an outpatient encounter)    Allergies  Allergen Reactions   Wound Dressing Adhesive Rash    Social History   Socioeconomic History   Marital status: Widowed    Spouse name: Not on file   Number of children: Not on file   Years of education: Not on file   Highest education level: Not on file  Occupational History   Not on file  Tobacco Use   Smoking status: Former   Smokeless tobacco: Never  Vaping Use   Vaping status: Never Used  Substance and Sexual Activity   Alcohol use: Never   Drug use: Never   Sexual activity: Not Currently    Birth control/protection: Surgical    Comment: hyst  Other Topics Concern   Not on file  Social History Narrative   Not on file   Social Drivers of Health   Financial Resource Strain: Not on file  Food Insecurity: No Food Insecurity (06/28/2022)   Hunger Vital Sign    Worried About Running Out of Food in the Last Year: Never true    Ran Out of Food in the Last Year: Never true  Transportation Needs: No Transportation Needs (06/28/2022)   PRAPARE - Administrator, Civil Service (Medical): No    Lack of Transportation (Non-Medical): No  Physical Activity: Not on file  Stress: Not on file  Social Connections: Not on file  Intimate Partner Violence: Not At Risk (06/28/2022)   Humiliation, Afraid, Rape, and Kick questionnaire    Fear of Current or Ex-Partner: No    Emotionally Abused: No    Physically Abused: No    Sexually Abused: No     History reviewed. No pertinent family history.   Review of Systems Pertinent items are noted in HPI.  Objective: Vital signs in last 24 hours:    01/22/2024   11:10 AM 09/10/2023    2:55 PM 09/04/2023    2:48 PM  Vitals with BMI  Height 5' 5    Weight 199 lbs    BMI 33.12    Systolic  121 109  Diastolic  74 55  Pulse  69 77      EXAM: General: Well  nourished, well developed. Awake, alert and oriented to time, place, person. Normal mood and affect. No apparent distress. Breathing room air.  Operative Lower Extremity: Alignment - Neutral Deformity - None Skin intact Tenderness to palpation - none 5/5 TA, PT, GS, Per, EHL, FHL Sensation intact to light touch throughout Palpable DP and PT pulses Special testing: None  The contralateral foot/ankle was examined for comparison and noted to be neurovascularly intact with no localized deformity, swelling, or tenderness.  Imaging Review All images taken were independently reviewed by me.  Assessment/Plan: The clinical and radiographic findings were reviewed and discussed at length with the patient.  The patient presents for right foot removal of deep hardware.  We spoke at length about the natural course of these findings. We discussed nonoperative and operative treatment options in detail.  The risks and benefits were presented and reviewed. The risks due to suture/hardware failure/irritation (or if removing hardware inability to remove part/all of hardware, recurrent instability), new/persistent/recurrent infection, stiffness, nerve/vessel/tendon injury, nonunion/malunion of any fracture, wound healing issues, allograft usage, development of arthritis, failure of this surgery, possibility of external fixation in certain situations, possibility of delayed definitive surgery, need for further surgery, prolonged wound care including further soft tissue coverage procedures, thromboembolic events, anesthesia/medical complications/events perioperatively and beyond, amputation, death among others were discussed. The patient acknowledged the explanation and agreed to proceed with the plan.  Lillia Mountain  Orthopaedic Surgery [facility]

## 2024-01-23 NOTE — Discharge Instructions (Addendum)
 Lillia Mountain, MD [facility]  Please read the following information regarding your care after surgery.  Medications  You only need a prescription for the narcotic pain medicine (ex. oxycodone , Percocet, Norco).  All of the other medicines listed below are available over the counter. ? acetaminophen  (Tylenol ) 500 mg every 4-6 hours as you need for minor to moderate pain  ? To help prevent blood clots, take aspirin  (81 mg) twice daily for 28 days after surgery.  You should also get up every hour while you are awake to move around.  Weight Bearing ? OK to walk on the operative leg only AFTER the nerve block has completely worn off.  Cast / Splint / Dressing ? Keep your dressing clean and dry.  Don't put anything (coat hanger, pencil, etc) down inside of it.  If it gets wet, please notify the office immediately.  Swelling IMPORTANT: It is normal for you to have swelling where you had surgery. To reduce swelling and pain, keep at least 3 pillows under your leg so that your toes are above your nose and your heel is above the level of your hip.  It may be necessary to keep your foot or leg elevated for several weeks.  This is critical to helping your incisions heal and your pain to feel better.  Follow Up Call my office at [phone] when you are discharged from the hospital or surgery center to schedule an appointment to be seen within 7-10 days after surgery.  Call my office at [phone] if you develop a fever >101.5 F, nausea, vomiting, bleeding from the surgical site or severe pain.    No tylenol  before 6:15 pm.   Post Anesthesia Home Care Instructions  Activity: Get plenty of rest for the remainder of the day. A responsible individual must stay with you for 24 hours following the procedure.  For the next 24 hours, DO NOT: -Drive a car -Advertising copywriter -Drink alcoholic beverages -Take any medication unless instructed by your physician -Make any legal decisions or sign  important papers.  Meals: Start with liquid foods such as gelatin or soup. Progress to regular foods as tolerated. Avoid greasy, spicy, heavy foods. If nausea and/or vomiting occur, drink only clear liquids until the nausea and/or vomiting subsides. Call your physician if vomiting continues.  Special Instructions/Symptoms: Your throat may feel dry or sore from the anesthesia or the breathing tube placed in your throat during surgery. If this causes discomfort, gargle with warm salt water . The discomfort should disappear within 24 hours.  If you had a scopolamine patch placed behind your ear for the management of post- operative nausea and/or vomiting:  1. The medication in the patch is effective for 72 hours, after which it should be removed.  Wrap patch in a tissue and discard in the trash. Wash hands thoroughly with soap and water . 2. You may remove the patch earlier than 72 hours if you experience unpleasant side effects which may include dry mouth, dizziness or visual disturbances. 3. Avoid touching the patch. Wash your hands with soap and water  after contact with the patch.

## 2024-01-24 ENCOUNTER — Encounter (HOSPITAL_BASED_OUTPATIENT_CLINIC_OR_DEPARTMENT_OTHER)
Admission: RE | Admit: 2024-01-24 | Discharge: 2024-01-24 | Disposition: A | Discharge status: Home / Self Care | Source: Ambulatory Visit | Attending: Orthopaedic Surgery | Admitting: Orthopaedic Surgery

## 2024-01-24 DIAGNOSIS — Z01812 Encounter for preprocedural laboratory examination: Secondary | ICD-10-CM | POA: Insufficient documentation

## 2024-01-24 DIAGNOSIS — Z01818 Encounter for other preprocedural examination: Secondary | ICD-10-CM | POA: Diagnosis present

## 2024-01-24 DIAGNOSIS — I10 Essential (primary) hypertension: Secondary | ICD-10-CM | POA: Diagnosis not present

## 2024-01-24 LAB — BASIC METABOLIC PANEL WITH GFR
Anion gap: 9 (ref 5–15)
BUN: 19 mg/dL (ref 8–23)
CO2: 27 mmol/L (ref 22–32)
Calcium: 9.1 mg/dL (ref 8.9–10.3)
Chloride: 102 mmol/L (ref 98–111)
Creatinine, Ser: 0.98 mg/dL (ref 0.44–1.00)
GFR, Estimated: >60 mL/min (ref >60)
Glucose, Bld: 105 mg/dL — ABNORMAL HIGH (ref 70–99)
Potassium: 4.4 mmol/L (ref 3.5–5.1)
Sodium: 138 mmol/L (ref 135–145)

## 2024-01-29 ENCOUNTER — Ambulatory Visit (HOSPITAL_BASED_OUTPATIENT_CLINIC_OR_DEPARTMENT_OTHER): Admitting: Anesthesiology

## 2024-01-29 ENCOUNTER — Encounter (HOSPITAL_BASED_OUTPATIENT_CLINIC_OR_DEPARTMENT_OTHER)
Admission: RE | Disposition: A | Discharge status: Home / Self Care | Payer: Self-pay | Source: Home / Self Care | Attending: Orthopaedic Surgery

## 2024-01-29 ENCOUNTER — Encounter (HOSPITAL_BASED_OUTPATIENT_CLINIC_OR_DEPARTMENT_OTHER): Payer: Self-pay | Admitting: Orthopaedic Surgery

## 2024-01-29 ENCOUNTER — Ambulatory Visit (HOSPITAL_BASED_OUTPATIENT_CLINIC_OR_DEPARTMENT_OTHER)
Admission: RE | Admit: 2024-01-29 | Discharge: 2024-01-29 | Disposition: A | Discharge status: Home / Self Care | Attending: Orthopaedic Surgery | Admitting: Orthopaedic Surgery

## 2024-01-29 ENCOUNTER — Other Ambulatory Visit: Payer: Self-pay

## 2024-01-29 ENCOUNTER — Ambulatory Visit (HOSPITAL_BASED_OUTPATIENT_CLINIC_OR_DEPARTMENT_OTHER)

## 2024-01-29 DIAGNOSIS — K219 Gastro-esophageal reflux disease without esophagitis: Secondary | ICD-10-CM | POA: Diagnosis not present

## 2024-01-29 DIAGNOSIS — S92324A Nondisplaced fracture of second metatarsal bone, right foot, initial encounter for closed fracture: Secondary | ICD-10-CM | POA: Diagnosis present

## 2024-01-29 DIAGNOSIS — X58XXXA Exposure to other specified factors, initial encounter: Secondary | ICD-10-CM | POA: Diagnosis not present

## 2024-01-29 DIAGNOSIS — I10 Essential (primary) hypertension: Secondary | ICD-10-CM | POA: Diagnosis not present

## 2024-01-29 DIAGNOSIS — M1990 Unspecified osteoarthritis, unspecified site: Secondary | ICD-10-CM | POA: Diagnosis not present

## 2024-01-29 DIAGNOSIS — Z79899 Other long term (current) drug therapy: Secondary | ICD-10-CM | POA: Insufficient documentation

## 2024-01-29 DIAGNOSIS — Z472 Encounter for removal of internal fixation device: Secondary | ICD-10-CM | POA: Diagnosis not present

## 2024-01-29 DIAGNOSIS — Z87891 Personal history of nicotine dependence: Secondary | ICD-10-CM | POA: Insufficient documentation

## 2024-01-29 HISTORY — PX: HARDWARE REMOVAL: SHX979

## 2024-01-29 SURGERY — REMOVAL, HARDWARE
Anesthesia: General | Site: Foot | Laterality: Right

## 2024-01-29 MED ORDER — ACETAMINOPHEN 500 MG PO TABS
ORAL_TABLET | ORAL | Status: AC
  Filled 2024-01-29: qty 2

## 2024-01-29 MED ORDER — BUPIVACAINE-EPINEPHRINE 0.5% -1:200000 IJ SOLN
INTRAMUSCULAR | Status: DC | PRN
  Administered 2024-01-29: 10 mL

## 2024-01-29 MED ORDER — 0.9 % SODIUM CHLORIDE (POUR BTL) OPTIME
TOPICAL | Status: DC | PRN
  Administered 2024-01-29: 1000 mL

## 2024-01-29 MED ORDER — CHLORHEXIDINE GLUCONATE 4 % EX SOLN
60.0000 mL | Freq: Once | CUTANEOUS | Status: AC
  Administered 2024-01-29: 4 via TOPICAL

## 2024-01-29 MED ORDER — CEFAZOLIN SODIUM-DEXTROSE 2-4 GM/100ML-% IV SOLN
2.0000 g | INTRAVENOUS | Status: AC
  Administered 2024-01-29: 2 g via INTRAVENOUS

## 2024-01-29 MED ORDER — KETOROLAC TROMETHAMINE 30 MG/ML IJ SOLN
INTRAMUSCULAR | Status: DC | PRN
  Administered 2024-01-29: 15 mg via INTRAVENOUS

## 2024-01-29 MED ORDER — BUPIVACAINE-EPINEPHRINE (PF) 0.5% -1:200000 IJ SOLN
INTRAMUSCULAR | Status: AC
  Filled 2024-01-29: qty 30

## 2024-01-29 MED ORDER — PROPOFOL 10 MG/ML IV BOLUS
INTRAVENOUS | Status: DC | PRN
  Administered 2024-01-29: 50 mg via INTRAVENOUS
  Administered 2024-01-29: 100 mg via INTRAVENOUS

## 2024-01-29 MED ORDER — FENTANYL CITRATE (PF) 100 MCG/2ML IJ SOLN: 25.0000 ug | INTRAMUSCULAR | Status: DC | PRN

## 2024-01-29 MED ORDER — LACTATED RINGERS IV SOLN: INTRAVENOUS | Status: DC

## 2024-01-29 MED ORDER — ONDANSETRON HCL 4 MG/2ML IJ SOLN
INTRAMUSCULAR | Status: DC | PRN
  Administered 2024-01-29 (×2): 4 mg via INTRAVENOUS

## 2024-01-29 MED ORDER — DEXAMETHASONE SODIUM PHOSPHATE 4 MG/ML IJ SOLN
INTRAMUSCULAR | Status: DC | PRN
  Administered 2024-01-29: 10 mg via INTRAVENOUS

## 2024-01-29 MED ORDER — FENTANYL CITRATE (PF) 100 MCG/2ML IJ SOLN
INTRAMUSCULAR | Status: AC
  Filled 2024-01-29: qty 2

## 2024-01-29 MED ORDER — ARTIFICIAL TEARS OPHTHALMIC OINT
TOPICAL_OINTMENT | OPHTHALMIC | Status: AC
  Filled 2024-01-29: qty 10.5

## 2024-01-29 MED ORDER — PHENYLEPHRINE HCL (PRESSORS) 10 MG/ML IV SOLN
INTRAVENOUS | Status: DC | PRN
  Administered 2024-01-29 (×2): 80 ug via INTRAVENOUS

## 2024-01-29 MED ORDER — EPHEDRINE SULFATE (PRESSORS) 50 MG/ML IJ SOLN
INTRAMUSCULAR | Status: DC | PRN
  Administered 2024-01-29: 10 mg via INTRAVENOUS
  Administered 2024-01-29: 5 mg via INTRAVENOUS
  Administered 2024-01-29: 10 mg via INTRAVENOUS

## 2024-01-29 MED ORDER — POVIDONE-IODINE 10 % EX SOLN
CUTANEOUS | Status: DC | PRN
  Administered 2024-01-29: 1 via TOPICAL

## 2024-01-29 MED ORDER — CEFAZOLIN SODIUM-DEXTROSE 2-4 GM/100ML-% IV SOLN
INTRAVENOUS | Status: AC
  Filled 2024-01-29: qty 100

## 2024-01-29 MED ORDER — FENTANYL CITRATE (PF) 100 MCG/2ML IJ SOLN
INTRAMUSCULAR | Status: DC | PRN
  Administered 2024-01-29: 50 ug via INTRAVENOUS

## 2024-01-29 MED ORDER — ACETAMINOPHEN 500 MG PO TABS
1000.0000 mg | ORAL_TABLET | Freq: Once | ORAL | Status: AC
  Administered 2024-01-29: 1000 mg via ORAL

## 2024-01-29 MED ORDER — LIDOCAINE HCL (CARDIAC) PF 100 MG/5ML IV SOSY
PREFILLED_SYRINGE | INTRAVENOUS | Status: DC | PRN
  Administered 2024-01-29: 50 mg via INTRAVENOUS

## 2024-01-29 SURGICAL SUPPLY — 42 items
BLADE SURG 15 STRL LF DISP TIS (BLADE) ×4 IMPLANT
BNDG ELASTIC 4INX 5YD STR LF (GAUZE/BANDAGES/DRESSINGS) ×1 IMPLANT
BNDG ELASTIC 6INX 5YD STR LF (GAUZE/BANDAGES/DRESSINGS) IMPLANT
BNDG GAUZE DERMACEA FLUFF 4 (GAUZE/BANDAGES/DRESSINGS) ×1 IMPLANT
CANISTER SUCT 1200ML W/VALVE (MISCELLANEOUS) ×1 IMPLANT
CHLORAPREP W/TINT 26 (MISCELLANEOUS) ×1 IMPLANT
COVER BACK TABLE 60X90IN (DRAPES) ×1 IMPLANT
CUFF TRNQT CYL 34X4.125X (TOURNIQUET CUFF) IMPLANT
DRAPE C-ARM 42X72 X-RAY (DRAPES) IMPLANT
DRAPE C-ARMOR (DRAPES) IMPLANT
DRAPE EXTREMITY T 121X128X90 (DISPOSABLE) ×1 IMPLANT
DRAPE IMP U-DRAPE 54X76 (DRAPES) ×1 IMPLANT
DRAPE OEC MINIVIEW 54X84 (DRAPES) IMPLANT
DRAPE U-SHAPE 47X51 STRL (DRAPES) ×1 IMPLANT
DRSG MEPITEL 4X7.2 (GAUZE/BANDAGES/DRESSINGS) ×1 IMPLANT
ELECTRODE REM PT RTRN 9FT ADLT (ELECTROSURGICAL) ×1 IMPLANT
GAUZE SPONGE 4X4 12PLY STRL (GAUZE/BANDAGES/DRESSINGS) ×1 IMPLANT
GLOVE BIOGEL PI IND STRL 8 (GLOVE) ×1 IMPLANT
GLOVE SURG SS PI 7.5 STRL IVOR (GLOVE) ×1 IMPLANT
GOWN STRL REUS W/ TWL LRG LVL3 (GOWN DISPOSABLE) ×2 IMPLANT
NDL HYPO 25X1 1.5 SAFETY (NEEDLE) IMPLANT
NEEDLE HYPO 25X1 1.5 SAFETY (NEEDLE) ×1 IMPLANT
NS IRRIG 1000ML POUR BTL (IV SOLUTION) ×1 IMPLANT
PACK BASIN DAY SURGERY FS (CUSTOM PROCEDURE TRAY) ×1 IMPLANT
PADDING CAST SYNTHETIC 4X4 STR (CAST SUPPLIES) IMPLANT
PENCIL SMOKE EVACUATOR (MISCELLANEOUS) IMPLANT
SHEET MEDIUM DRAPE 40X70 STRL (DRAPES) ×1 IMPLANT
SLEEVE SCD COMPRESS KNEE MED (STOCKING) ×1 IMPLANT
SPIKE FLUID TRANSFER (MISCELLANEOUS) IMPLANT
SPONGE T-LAP 18X18 ~~LOC~~+RFID (SPONGE) ×1 IMPLANT
STAPLER SKIN PROX WIDE 3.9 (STAPLE) IMPLANT
SUCTION TUBE FRAZIER 10FR DISP (SUCTIONS) IMPLANT
SUT ETHILON 2 0 FS 18 (SUTURE) ×2 IMPLANT
SUT MNCRL AB 3-0 PS2 18 (SUTURE) IMPLANT
SUT VIC AB 0 CT1 27XBRD ANBCTR (SUTURE) IMPLANT
SUT VIC AB 2-0 CT1 TAPERPNT 27 (SUTURE) ×1 IMPLANT
SUT VIC AB 3-0 SH 27X BRD (SUTURE) IMPLANT
SYR BULB IRRIG 60ML STRL (SYRINGE) ×1 IMPLANT
SYR CONTROL 10ML LL (SYRINGE) IMPLANT
TOWEL GREEN STERILE FF (TOWEL DISPOSABLE) ×2 IMPLANT
TUBE CONNECTING 20X1/4 (TUBING) IMPLANT
UNDERPAD 30X36 HEAVY ABSORB (UNDERPADS AND DIAPERS) ×1 IMPLANT

## 2024-01-29 NOTE — H&P (Signed)
 H&P Update:  -History and Physical Reviewed  -Patient has been re-examined  -No change in the plan of care  -The risks and benefits were presented and reviewed. The risks due to inability to remove part/all of hardware, recurrent instability, hardware/suture failure and/or irritation, new/persistent infection, stiffness, nerve/vessel/tendon injury or rerupture of repaired tendon, nonunion/malunion, allograft usage, wound healing issues, development of arthritis, failure of this surgery, possibility of external fixation with delayed definitive surgery, need for further surgery, thromboembolic events, anesthesia/medical complications, amputation, death among others were discussed. The patient acknowledged the explanation, agreed to proceed with the plan and a consent was signed.  [name]

## 2024-01-29 NOTE — Anesthesia Postprocedure Evaluation (Signed)
 Anesthesia Post Note  Patient: [name]  Procedure(s) Performed: REMOVAL, HARDWARE (Right: Foot)     Patient location during evaluation: PACU Anesthesia Type: General Level of consciousness: awake and alert Pain management: pain level controlled Vital Signs Assessment: post-procedure vital signs reviewed and stable Respiratory status: spontaneous breathing, nonlabored ventilation and respiratory function stable Cardiovascular status: blood pressure returned to baseline and stable Postop Assessment: no apparent nausea or vomiting Anesthetic complications: no   No notable events documented.  Last Vitals:  Vitals:   01/29/24 1415 01/29/24 1432  BP: (!) 96/59 (!) 113/50  Pulse: 69 72  Resp: 17 16  Temp:  36.6 C  SpO2: 94% 98%    Last Pain:  Vitals:   01/29/24 1432  TempSrc: Temporal  PainSc: 0-No pain                 Lavelle Berland,W. EDMOND

## 2024-01-29 NOTE — Op Note (Signed)
 01/29/2024  3:46 PM   PATIENT: [name]  68 y.o. female  MRN: [id number]   PRE-OPERATIVE DIAGNOSIS:   Right foot symptomatic orthopaedic hardware   POST-OPERATIVE DIAGNOSIS:   Same   PROCEDURE: Right foot removal of hardware   SURGEON:  Lillia Mountain, MD   ASSISTANT: None   ANESTHESIA: General, regional   EBL: Minimal   TOURNIQUET:   5 min   COMPLICATIONS: None apparent   DISPOSITION: Extubated, awake and stable to recovery.   INDICATION FOR PROCEDURE: The patient presented with above diagnosis.  We discussed the diagnosis, alternative treatment options, risks and benefits of the above surgical intervention, as well as alternative non-operative treatments. All questions/concerns were addressed and the patient/family demonstrated appropriate understanding of the diagnosis, the procedure, the postoperative course, and overall prognosis. The patient wished to proceed with surgical intervention and signed an informed surgical consent as such, in each others presence prior to surgery.   PROCEDURE IN DETAIL: After preoperative consent was obtained and the correct operative site was identified, the patient was brought to the operating room supine on stretcher and transferred onto operating table. General anesthesia was induced. Preoperative antibiotics were administered. Surgical timeout was taken. The patient was then positioned supine with an ipsilateral hip bump. The operative lower extremity was prepped and draped in standard sterile fashion with calf esmarch.  Prior medial approach was utilized over the screw heads and dissection carried down to the level of bone. Two solid 4.0 fully threaded headed screws were removed completely and stability of the midfoot was noted to clinical and fluoroscopic testing.    The surgical sites were thoroughly irrigated. The tourniquet was deflated and hemostasis achieved. Betadine  solution was used to irrigate and  vancomycin  powder applied. The skin was closed without tension using 2-0 nylon suture.    The leg was cleaned with saline and sterile mepitel dressings with gauze were applied. A well padded sterile wrap was applied. The patient was awakened from anesthesia and transported to the recovery room in stable condition.    FOLLOW UP PLAN: -transfer to PACU, then home -strict NWB operative extremity until nerve block wears off, then OK to WBAT in postop shoe, maximum elevation -maintain dressings until follow up -DVT ppx: Aspirin  81 mg twice daily while NWB -follow up as outpatient within 7-10 days for wound check -sutures out in 2-3 weeks in outpatient office   RADIOGRAPHS: AP, lateral, oblique and stress radiographs of the operative foot were obtained intraoperatively. These showed interval removal of the midfoot screws. Manual stress radiographs were taken and the joints were noted to be stable following hardware removal. No other acute injuries are noted.   Lillia Mountain Orthopaedic Surgery [facility]

## 2024-01-29 NOTE — Transfer of Care (Signed)
 Immediate Anesthesia Transfer of Care Note  Patient: [name]  Procedure(s) Performed: REMOVAL, HARDWARE (Right: Foot)  Patient Location: PACU  Anesthesia Type:General  Level of Consciousness: sedated  Airway & Oxygen Therapy: Patient Spontanous Breathing and Patient connected to nasal cannula oxygen  Post-op Assessment: Report given to RN and Post -op Vital signs reviewed and stable  Post vital signs: Reviewed and stable  Last Vitals:  Vitals Value Taken Time  BP 92/54 01/29/24 13:39  Temp    Pulse 79 01/29/24 13:41  Resp 12 01/29/24 13:41  SpO2 97 % 01/29/24 13:41  Vitals shown include unfiled device data.  Last Pain:  Vitals:   01/29/24 1205  TempSrc: Temporal  PainSc: 0-No pain      Patients Stated Pain Goal: 4 (01/29/24 1205)  Complications: No notable events documented.

## 2024-01-29 NOTE — Anesthesia Preprocedure Evaluation (Addendum)
 Anesthesia Evaluation  Patient identified by MRN, date of birth, ID band Patient awake    Reviewed: Allergy & Precautions, H&P , NPO status , Patient's Chart, lab work & pertinent test results  Airway Mallampati: II  TM Distance: >3 FB Neck ROM: Full    Dental no notable dental hx. (+) Teeth Intact, Dental Advisory Given   Pulmonary former smoker   Pulmonary exam normal breath sounds clear to auscultation       Cardiovascular hypertension, Pt. on medications  Rhythm:Regular Rate:Normal     Neuro/Psych negative neurological ROS  negative psych ROS   GI/Hepatic Neg liver ROS,GERD  ,,  Endo/Other  negative endocrine ROS    Renal/GU negative Renal ROS  negative genitourinary   Musculoskeletal  (+) Arthritis , Osteoarthritis,    Abdominal   Peds  Hematology negative hematology ROS (+)   Anesthesia Other Findings   Reproductive/Obstetrics negative OB ROS                              Anesthesia Physical Anesthesia Plan  ASA: 2  Anesthesia Plan: General   Post-op Pain Management: Tylenol  PO (pre-op)*   Induction: Intravenous  PONV Risk Score and Plan: 4 or greater and Ondansetron , Dexamethasone  and Treatment may vary due to age or medical condition  Airway Management Planned: LMA  Additional Equipment:   Intra-op Plan:   Post-operative Plan: Extubation in OR  Informed Consent: I have reviewed the patients History and Physical, chart, labs and discussed the procedure including the risks, benefits and alternatives for the proposed anesthesia with the patient or authorized representative who has indicated his/her understanding and acceptance.     Dental advisory given  Plan Discussed with: CRNA  Anesthesia Plan Comments:          Anesthesia Quick Evaluation

## 2024-01-30 ENCOUNTER — Encounter (HOSPITAL_BASED_OUTPATIENT_CLINIC_OR_DEPARTMENT_OTHER): Payer: Self-pay | Admitting: Orthopaedic Surgery

## 2024-02-24 ENCOUNTER — Encounter: Payer: Self-pay | Admitting: Radiology

## 2024-03-29 ENCOUNTER — Ambulatory Visit
Admission: EM | Admit: 2024-03-29 | Discharge: 2024-03-29 | Disposition: A | Discharge status: Home / Self Care | Attending: Family Medicine | Admitting: Family Medicine

## 2024-03-29 DIAGNOSIS — H65192 Other acute nonsuppurative otitis media, left ear: Secondary | ICD-10-CM | POA: Diagnosis not present

## 2024-03-29 MED ORDER — AZELASTINE HCL 0.1 % NA SOLN: 1.0000 | Freq: Two times a day (BID) | NASAL | 0 refills | Status: AC

## 2024-03-29 MED ORDER — CORICIDIN HBP 10-325-2 MG PO TABS: 1.0000 | ORAL_TABLET | Freq: Three times a day (TID) | ORAL | 0 refills | Status: AC | PRN

## 2024-03-29 NOTE — Discharge Instructions (Addendum)
 I have prescribed a nasal spray and Coricidin HBP which is also available over-the-counter if unable to receive via prescription.  In addition, you may take Tylenol  as needed, use saline sinus rinses, humidifiers, antihistamines if having seasonal allergy issues.

## 2024-03-29 NOTE — ED Provider Notes (Signed)
 [facility]    CSN: [id number] Arrival date & time: 03/29/24  1528      History   Chief Complaint No chief complaint on file.   HPI [name] is a 68 y.o. female.   Patient presenting today with 2-day history of left ear pain and pressure.  States she is resolving from an upper respiratory infection for which her primary care gave her a Z-Pak.  Completed this yesterday.  Notes that it helped with her nasal congestion but the ear pain is new.  Denies fever, chills, bleeding, drainage, loss of hearing, headache.  Not trying anything over-the-counter for symptoms.    Past Medical History:  Diagnosis Date   Arthritis    lt knee   GERD (gastroesophageal reflux disease)    Hypercholesteremia    Hypertension    Neuropathy    Osteomyelitis (HCC)    left 2nd toe   Pneumonia    as a child   Pre-diabetes    Ulcer of left lower extremity [facility])     Patient Active Problem List   Diagnosis Date Noted   Open wound of left lower extremity 09/10/2023   Medication management 09/10/2023   S/P total knee arthroplasty, left 06/28/2022   Medication monitoring encounter 01/19/2022   Pyogenic inflammation of bone (HCC) 01/19/2022   Prediabetes 01/19/2022   Neuropathy 01/19/2022   Change in bowel habits 07/09/2018   History of colonic polyps 07/09/2018    Past Surgical History:  Procedure Laterality Date   ABDOMINAL HYSTERECTOMY     AMPUTATION TOE Left 01/11/2022   Procedure: Left 2nd toe partial amputation;  Surgeon: Barton Drape, MD;  Location: [facility];  Service: Orthopedics;  Laterality: Left;  60 min   BIOPSY  03/13/2018   Procedure: BIOPSY;  Surgeon: Shaaron Lamar CHRISTELLA, MD;  Location: [facility];  Service: Endoscopy;;  (colon)   BLADDER SUSPENSION     COLONOSCOPY WITH PROPOFOL  N/A 03/13/2018   Procedure: COLONOSCOPY WITH PROPOFOL ;  Surgeon: Shaaron Lamar CHRISTELLA, MD;  Location: [facility];  Service: Endoscopy;  Laterality: N/A;   1:00pm-rescheduled to 11/21 @ 1:30pm per Mindy   COLONOSCOPY WITH PROPOFOL  N/A 07/05/2023   Procedure: COLONOSCOPY WITH PROPOFOL ;  Surgeon: Shaaron Lamar CHRISTELLA, MD;  Location: [facility];  Service: Endoscopy;  Laterality: N/A;  11:15 am, asa 2   HARDWARE REMOVAL Right 01/29/2024   Procedure: REMOVAL, HARDWARE;  Surgeon: Barton Drape, MD;  Location: [facility];  Service: Orthopedics;  Laterality: Right;  right foot removal of deep hardware   INCISION AND DRAINAGE OF WOUND Left 06/05/2023   Procedure: LEFT LEG IRRIGATION AND DEBRIDEMENT WOUND;  Surgeon: Barton Drape, MD;  Location: [facility];  Service: Orthopedics;  Laterality: Left;   OPEN REDUCTION INTERNAL FIXATION (ORIF) FOOT LISFRANC FRACTURE Right 09/04/2023   Procedure: OPEN REDUCTION INTERNAL FIXATION (ORIF) FOOT LISFRANC FRACTURE;  Surgeon: Barton Drape, MD;  Location: [facility];  Service: Orthopedics;  Laterality: Right;   POLYPECTOMY  07/05/2023   Procedure: POLYPECTOMY;  Surgeon: Shaaron Lamar CHRISTELLA, MD;  Location: [facility];  Service: Endoscopy;;   TOTAL KNEE ARTHROPLASTY Left 06/28/2022   Procedure: TOTAL KNEE ARTHROPLASTY;  Surgeon: Ernie Cough, MD;  Location: [facility];  Service: Orthopedics;  Laterality: Left;    OB History   No obstetric history on file.      Home Medications    Prior to Admission medications   Medication Sig Start Date End Date Taking? Authorizing  Provider  azelastine  (ASTELIN ) 0.1 % nasal spray Place 1 spray into both nostrils 2 (two) times daily. Use in each nostril as directed 03/29/24  Yes Stuart Vernell Norris, PA-C  DM-APAP-CPM (CORICIDIN HBP) 10-325-2 MG TABS Take 1 tablet by mouth 3 (three) times daily as needed. 03/29/24  Yes Stuart Vernell Norris, PA-C  amLODipine  (NORVASC ) 5 MG tablet Take 5 mg by mouth at bedtime.    [provider]  atorvastatin  (LIPITOR) 20 MG tablet Take 20 mg by mouth daily.    [provider]  B  Complex-C-Folic Acid  (SUPER B COMPLEX/FA/VIT C PO) Take by mouth.    [provider]  Calcium  Citrate-Vitamin D (CITRACAL PETITES/VITAMIN D PO) Take 2 tablets by mouth 2 (two) times daily.     [provider]  Cholecalciferol (VITAMIN D3) 250 MCG (10000 UT) capsule Take 10,000 Units by mouth in the morning.    [provider]  docusate sodium  (COLACE) 100 MG capsule Take 100 mg by mouth daily as needed for mild constipation.    [provider]  gabapentin  (NEURONTIN ) 300 MG capsule Take 600 mg by mouth at bedtime. 01/17/18   [provider]  Iron-Vit C-Vit B12-Folic Acid  (IRON 100 PLUS PO) Take by mouth.    [provider]  Multiple Vitamin (MULTIVITAMIN) tablet Take 1 tablet by mouth daily. Centrum silver Woman    [provider]  olmesartan -hydrochlorothiazide  (BENICAR  HCT) 40-25 MG tablet Take 1 tablet by mouth in the morning.    [provider]  Omega-3 Fatty Acids (FISH OIL) 1000 MG CAPS Take 1,000 mg by mouth daily.    [provider]  Probiotic Product (PROBIOTIC PO) Take 1 capsule by mouth in the morning.    [provider]    Family History History reviewed. No pertinent family history.  Social History Social History   Tobacco Use   Smoking status: Former   Smokeless tobacco: Never  Vaping Use   Vaping status: Never Used  Substance Use Topics   Alcohol use: Never   Drug use: Never     Allergies   Wound dressing adhesive   Review of Systems Review of Systems PER HPI  Physical Exam Triage Vital Signs ED Triage Vitals  Encounter Vitals Group     BP 03/29/24 1536 (!) 151/88     Girls Systolic BP Percentile --      Girls Diastolic BP Percentile --      Boys Systolic BP Percentile --      Boys Diastolic BP Percentile --      Pulse Rate 03/29/24 1536 75     Resp 03/29/24 1536 20     Temp 03/29/24 1536 (!) 97.3 F (36.3 C)     Temp src --      SpO2 03/29/24 1536 93 %      Weight --      Height --      Head Circumference --      Peak Flow --      Pain Score 03/29/24 1538 6     Pain Loc --      Pain Education --      Exclude from Growth Chart --    No data found.  Updated Vital Signs BP (!) 151/88 (BP Location: Right Arm)   Pulse 75   Temp (!) 97.3 F (36.3 C)   Resp 20   SpO2 93%   Visual Acuity Right Eye Distance:   Left Eye Distance:   Bilateral Distance:  Right Eye Near:   Left Eye Near:    Bilateral Near:     Physical Exam Vitals and nursing note reviewed.  Constitutional:      Appearance: Normal appearance.  HENT:     Head: Atraumatic.     Right Ear: Tympanic membrane and external ear normal.     Left Ear: External ear normal.     Ears:     Comments: Mild left middle ear effusion    Nose: Nose normal.     Mouth/Throat:     Mouth: Mucous membranes are moist.     Pharynx: No posterior oropharyngeal erythema.  Eyes:     Extraocular Movements: Extraocular movements intact.     Conjunctiva/sclera: Conjunctivae normal.  Cardiovascular:     Rate and Rhythm: Normal rate and regular rhythm.     Heart sounds: Normal heart sounds.  Pulmonary:     Effort: Pulmonary effort is normal.     Breath sounds: Normal breath sounds. No wheezing or rales.  Musculoskeletal:        General: Normal range of motion.     Cervical back: Normal range of motion and neck supple.  Skin:    General: Skin is warm and dry.  Neurological:     Mental Status: She is alert and oriented to person, place, and time.  Psychiatric:        Mood and Affect: Mood normal.        Thought Content: Thought content normal.      UC Treatments / Results  Labs (all labs ordered are listed, but only abnormal results are displayed) Labs Reviewed - No data to display  EKG   Radiology No results found.  Procedures Procedures (including critical care time)  Medications Ordered in UC Medications - No data to display  Initial Impression / Assessment and Plan  / UC Course  I have reviewed the triage vital signs and the nursing notes.  Pertinent labs & imaging results that were available during my care of the patient were reviewed by me and considered in my medical decision making (see chart for details).     Vitals and exam overall reassuring today, will treat with Astelin , Coricidin HBP for middle ear effusion.  Return for worsening or unresolving symptoms.  Final Clinical Impressions(s) / UC Diagnoses   Final diagnoses:  Acute MEE (middle ear effusion), left     Discharge Instructions      I have prescribed a nasal spray and Coricidin HBP which is also available over-the-counter if unable to receive via prescription.  In addition, you may take Tylenol  as needed, use saline sinus rinses, humidifiers, antihistamines if having seasonal allergy issues.      ED Prescriptions     Medication Sig Dispense Auth. Provider   DM-APAP-CPM (CORICIDIN HBP) 10-325-2 MG TABS Take 1 tablet by mouth 3 (three) times daily as needed. 15 tablet Stuart Vernell Norris, PA-C   azelastine  (ASTELIN ) 0.1 % nasal spray Place 1 spray into both nostrils 2 (two) times daily. Use in each nostril as directed 30 mL Stuart Vernell Norris, PA-C      PDMP not reviewed this encounter.   Stuart Vernell Norris, [place] 03/29/24 1559

## 2024-03-29 NOTE — ED Triage Notes (Signed)
 Pt reports she has some nasal congestion, left ear pain x 2 days

## 2024-05-15 ENCOUNTER — Other Ambulatory Visit (HOSPITAL_COMMUNITY): Payer: Self-pay | Admitting: Family Medicine

## 2024-05-15 DIAGNOSIS — Z1231 Encounter for screening mammogram for malignant neoplasm of breast: Secondary | ICD-10-CM

## 2024-06-01 ENCOUNTER — Ambulatory Visit (HOSPITAL_COMMUNITY)
